# Patient Record
Sex: Male | Born: 1990
Health system: Southern US, Community
[De-identification: ages and names within clinical notes are randomized; demographics above are authoritative.]

## PROBLEM LIST (undated history)

## (undated) DIAGNOSIS — Z87442 Personal history of urinary calculi: Secondary | ICD-10-CM

## (undated) HISTORY — DX: Personal history of urinary calculi: Z87.442

---
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
## 2009-12-25 ENCOUNTER — Ambulatory Visit: Payer: Self-pay | Admitting: Family Medicine

## 2009-12-25 DIAGNOSIS — J309 Allergic rhinitis, unspecified: Secondary | ICD-10-CM | POA: Insufficient documentation

## 2009-12-25 DIAGNOSIS — IMO0002 Reserved for concepts with insufficient information to code with codable children: Secondary | ICD-10-CM | POA: Insufficient documentation

## 2009-12-25 DIAGNOSIS — S93409A Sprain of unspecified ligament of unspecified ankle, initial encounter: Secondary | ICD-10-CM | POA: Insufficient documentation

## 2009-12-27 ENCOUNTER — Ambulatory Visit: Payer: Self-pay | Admitting: Family Medicine

## 2009-12-27 LAB — CONVERTED CEMR LAB
Bilirubin Urine: NEGATIVE
Glucose, Urine, Semiquant: NEGATIVE
pH: 7

## 2009-12-29 LAB — CONVERTED CEMR LAB
BUN: 7 mg/dL (ref 6–23)
Bilirubin, Direct: 0 mg/dL (ref 0.0–0.3)
Calcium: 9.5 mg/dL (ref 8.4–10.5)
Chloride: 107 meq/L (ref 96–112)
Cholesterol: 171 mg/dL (ref 0–200)
Creatinine, Ser: 1 mg/dL (ref 0.4–1.5)
Eosinophils Absolute: 0.2 10*3/uL (ref 0.0–0.7)
Eosinophils Relative: 2.9 % (ref 0.0–5.0)
HDL: 33.2 mg/dL — ABNORMAL LOW (ref >39.00)
LDL Cholesterol: 102 mg/dL — ABNORMAL HIGH (ref 0–99)
MCHC: 34.6 g/dL (ref 30.0–36.0)
MCV: 86.9 fL (ref 78.0–100.0)
Monocytes Absolute: 0.7 10*3/uL (ref 0.1–1.0)
Neutrophils Relative %: 46.6 % (ref 43.0–77.0)
Platelets: 123 10*3/uL — ABNORMAL LOW (ref 150.0–400.0)
Total Bilirubin: 0.6 mg/dL (ref 0.3–1.2)
Triglycerides: 177 mg/dL — ABNORMAL HIGH (ref 0.0–149.0)
WBC: 7.1 10*3/uL (ref 4.5–10.5)

## 2010-11-15 NOTE — Assessment & Plan Note (Signed)
Summary: NEW PT EST (CPX REQ?) // RS   Vital Signs:  Patient profile:   20 year old male Height:      69 inches Weight:      222 pounds BMI:     32.90 Temp:     98.5 degrees F oral Pulse rate:   64 / minute Pulse rhythm:   regular BP sitting:   136 / 72  (left arm) Cuff size:   regular  Vitals Entered By: Raechel Ache, RN (December 25, 2009 1:22 PM) CC: New for CPX. C/o L ankle pain and knot under L knee.   History of Present Illness: 20 yr old male to establish with [place] and for a cpx. He feels good except for 2 areas of pain. Both of these pains started about the same time one month ago. He does not remember any particular trauma, but he does play a lot of basketball. He points to the medial left knee and the laeft lateral ankle as the trouble spots. They never swell. he used ice on them once but not since then. He takes nothing for them. He has stopped playing basketball for the past 2 weeks.   Preventive Screening-Counseling & Management  Alcohol-Tobacco     Smoking Status: never  Allergies (verified): No Known Drug Allergies  Past History:  Past Medical History: Allergic rhinitis  Past Surgical History: bilateral inguinal hernia  repairs  as an infant  Family History: Reviewed history and no changes required. Family History of Alcoholism/Addiction Family History of Arthritis Family History Diabetes 1st degree relative Family History Hypertension  Social History: Reviewed history and no changes required. Occupation:  works at [employer] Alcohol use-no Smoking Status:  never Occupation:  employed  Review of Systems  The patient denies anorexia, fever, weight loss, weight gain, vision loss, decreased hearing, hoarseness, chest pain, syncope, dyspnea on exertion, peripheral edema, prolonged cough, headaches, hemoptysis, abdominal pain, melena, hematochezia, severe indigestion/heartburn, hematuria, incontinence, genital sores, muscle weakness,  suspicious skin lesions, transient blindness, difficulty walking, depression, unusual weight change, abnormal bleeding, enlarged lymph nodes, angioedema, breast masses, and testicular masses.    Physical Exam  General:  overweight-appearing.  Walks easily Head:  Normocephalic and atraumatic without obvious abnormalities. No apparent alopecia or balding. Eyes:  No corneal or conjunctival inflammation noted. EOMI. Perrla. Funduscopic exam benign, without hemorrhages, exudates or papilledema. Vision grossly normal. Ears:  External ear exam shows no significant lesions or deformities.  Otoscopic examination reveals clear canals, tympanic membranes are intact bilaterally without bulging, retraction, inflammation or discharge. Hearing is grossly normal bilaterally. Nose:  External nasal examination shows no deformity or inflammation. Nasal mucosa are pink and moist without lesions or exudates. Mouth:  Oral mucosa and oropharynx without lesions or exudates.  Teeth in good repair. Neck:  No deformities, masses, or tenderness noted. Chest Wall:  No deformities, masses, tenderness or gynecomastia noted. Lungs:  Normal respiratory effort, chest expands symmetrically. Lungs are clear to auscultation, no crackles or wheezes. Heart:  Normal rate and regular rhythm. S1 and S2 normal without gallop, murmur, click, rub or other extra sounds. Abdomen:  Bowel sounds positive,abdomen soft and non-tender without masses, organomegaly or hernias noted. Genitalia:  Testes bilaterally descended without nodularity, tenderness or masses. No scrotal masses or lesions. No penis lesions or urethral discharge. Msk:  No deformity or scoliosis noted of thoracic or lumbar spine.   Pulses:  R and L carotid,radial,femoral,dorsalis pedis and posterior tibial pulses are full and equal bilaterally  Extremities:  No clubbing, cyanosis, edema, or deformity noted with normal full range of motion of all joints.  The left knee has no swelling  or crepitus. Full ROM. he is tender along the medial side of the knee along the MCL. Anterior drawer and McMurrays are negative. The left lateral foot is slightly tender proximal and anterior to the lateral malleolus. Full ROM, no swelling. Neurologic:  No cranial nerve deficits noted. Station and gait are normal. Plantar reflexes are down-going bilaterally. DTRs are symmetrical throughout. Sensory, motor and coordinative functions appear intact. Skin:  Intact without suspicious lesions or rashes Cervical Nodes:  No lymphadenopathy noted Axillary Nodes:  No palpable lymphadenopathy Inguinal Nodes:  No significant adenopathy Psych:  Cognition and judgment appear intact. Alert and cooperative with normal attention span and concentration. No apparent delusions, illusions, hallucinations   Impression & Recommendations:  Problem # 1:  HEALTH MAINTENANCE EXAM (ICD-V70.0)  Problem # 2:  ANKLE SPRAIN (ICD-845.00)  Problem # 3:  KNEE SPRAIN (ICD-844.9)  Complete Medication List: 1)  Allegra 180 Mg Tabs (Fexofenadine hcl) .Marland Kitchen.. 1 once daily as needed  Patient Instructions: 1)  He seems to have an MCL sprain of the left knee and a high left ankle sprain, both probably from basketball. Rest, no basketball for another month. Use Aleeve as needed and ice packs. Wear Neoprene support sleeves. set up fasting labs

## 2012-01-17 ENCOUNTER — Ambulatory Visit: Payer: Self-pay

## 2012-01-17 ENCOUNTER — Other Ambulatory Visit: Payer: Self-pay | Admitting: Occupational Medicine

## 2012-01-17 DIAGNOSIS — R7611 Nonspecific reaction to tuberculin skin test without active tuberculosis: Secondary | ICD-10-CM

## 2013-06-24 ENCOUNTER — Encounter: Payer: Self-pay | Admitting: Family

## 2013-06-24 ENCOUNTER — Ambulatory Visit (INDEPENDENT_AMBULATORY_CARE_PROVIDER_SITE_OTHER): Payer: BC Managed Care – PPO | Admitting: Family

## 2013-06-24 ENCOUNTER — Ambulatory Visit (INDEPENDENT_AMBULATORY_CARE_PROVIDER_SITE_OTHER)
Admission: RE | Admit: 2013-06-24 | Discharge: 2013-06-24 | Disposition: A | Discharge status: Home / Self Care | Payer: BC Managed Care – PPO | Source: Ambulatory Visit | Attending: Family | Admitting: Family

## 2013-06-24 ENCOUNTER — Telehealth: Payer: Self-pay | Admitting: Family Medicine

## 2013-06-24 VITALS — BP 120/80 | HR 82 | Ht 69.0 in | Wt 222.0 lb

## 2013-06-24 DIAGNOSIS — R112 Nausea with vomiting, unspecified: Secondary | ICD-10-CM

## 2013-06-24 DIAGNOSIS — R109 Unspecified abdominal pain: Secondary | ICD-10-CM

## 2013-06-24 LAB — CBC WITH DIFFERENTIAL/PLATELET
Eosinophils Relative: 1.9 % (ref 0.0–5.0)
HCT: 46.3 % (ref 39.0–52.0)
Hemoglobin: 15.9 g/dL (ref 13.0–17.0)
Lymphs Abs: 3.7 10*3/uL (ref 0.7–4.0)
Monocytes Relative: 10 % (ref 3.0–12.0)
Neutro Abs: 6.1 10*3/uL (ref 1.4–7.7)
Platelets: 156 10*3/uL (ref 150.0–400.0)
WBC: 11.2 10*3/uL — ABNORMAL HIGH (ref 4.5–10.5)

## 2013-06-24 LAB — BASIC METABOLIC PANEL
CO2: 29 mEq/L (ref 19–32)
Calcium: 9.5 mg/dL (ref 8.4–10.5)
Glucose, Bld: 74 mg/dL (ref 70–99)
Sodium: 138 mEq/L (ref 135–145)

## 2013-06-24 NOTE — Telephone Encounter (Signed)
Pt not seen in over 3 years. Will you accept back as a new pt?

## 2013-06-24 NOTE — Patient Instructions (Signed)
Lactose Intolerance, Adult  Lactose intolerance is when the body is not able to digest lactose, a sugar found in milk and milk products. Lactose intolerance is caused by your body not producing enough of the enzyme lactase. When there is not enough lactase to digest the amount of lactose consumed, discomfort may be felt. Lactose intolerance is not a milk allergy.  For most people, lactase deficiency is a condition that develops naturally over time. After about the age of 2, the body begins to produce less lactase. But many people may not experience symptoms until they are much older.  CAUSES  Things that can cause you to be lactose intolerant include:  · Aging.  · Being born without the ability to make lactase.  · Certain digestive diseases.  · Injuries to the small intestine.  SYMPTOMS   · Feeling sick to your stomach (nauseous).  · Diarrhea.  · Cramps.  · Bloating.  · Gas.  Symptoms usually show up a half hour or 2 hours after eating or drinking products containing lactose.  TREATMENT   No treatment can improve the body's ability to produce lactase. However, symptoms can be controlled through diet. A medicine may be given to you to take when you consume lactose-containing foods or drinks. The medicine contains the lactase enzyme, which help the body digest lactose better.  HOME CARE INSTRUCTIONS  · Eat or drink dairy products as told by your caregiver or dietician.  · Take all medicine as directed by your caregiver.  · Find lactose-free or lactose-reduced products at your local grocery store.  · Talk to your caregiver or dietician to decide if you need any dietary supplements.  The following is the amount of calcium needed from the diet:  · 19 to 50 years: 1000 mg  · Over 50 years: 1200 mg  Calcium and Lactose in Common Foods  Non-Dairy Products / Calcium Content (mg)  · Calcium-fortified orange juice, 1 cup / 308 to 344 mg  · Sardines, with edible bones, 3 oz / 270 mg  · Salmon, canned, with edible bones, 3 oz /  205 mg  · Soymilk, fortified, 1 cup / 200 mg  · Broccoli (raw), 1 cup / 90 mg  · Orange, 1 medium / 50 mg  · Pinto beans, ½ cup / 40 mg  · Tuna, canned, 3 oz / 10 mg  · Lettuce greens, ½ cup / 10 mg  Dairy Products / Calcium Content (mg) / Lactose Content (g)  · Yogurt, plain, low-fat, 1 cup / 415 mg / 5 g  · Milk, reduced fat, 1 cup / 295 mg / 11 g  · Swiss cheese, 1 oz / 270 mg / 1 g  · Ice cream, ½ cup / 85 mg / 6 g  · Cottage cheese, ½ cup / 75 mg / 2 to 3 g  SEEK MEDICAL CARE IF:  You have no relief from your symptoms.  Document Released: 09/30/2005 Document Revised: 12/23/2011 Document Reviewed: 12/28/2010  Exit[employer]® Patient Information ©2014 [employer].

## 2013-06-24 NOTE — Telephone Encounter (Signed)
Yes I can see him again  

## 2013-06-25 ENCOUNTER — Encounter: Payer: Self-pay | Admitting: Family

## 2013-06-25 LAB — LACTOSE TOLERANCE TEST

## 2013-06-25 NOTE — Progress Notes (Signed)
  Subjective:    Patient ID: Anthony Gaines, male    DOB: 1991/02/24, 22 y.o.   MRN: [id number]  HPI 22 year old African American male, nonsmoker is in with complaints of right lower quadrant abdominal pain that occurred yesterday and has subsequently resolved. He describes the pain as sharp and constant. He had an episode similar to this about 2-3 weeks ago. He reports an episode approximately one month ago. Denies any nausea, vomiting, diarrhea or constipation. Has daily bowel movements. Patient feels like it may be related to the consumption of milk and cheese. No family history of any colon problems.   Review of Systems  Constitutional: Negative.   Respiratory: Negative.   Cardiovascular: Negative.   Gastrointestinal: Positive for abdominal pain. Negative for nausea, vomiting, diarrhea and constipation.  Genitourinary: Negative.   Musculoskeletal: Negative.   Skin: Negative.   Neurological: Negative.   Hematological: Negative.   Psychiatric/Behavioral: Negative.    History reviewed. No pertinent past medical history.  History   Social History  . Marital Status: Married    Spouse Name: N/A    Number of Children: N/A  . Years of Education: N/A   Occupational History  . Not on file.   Social History Main Topics  . Smoking status: Never Smoker   . Smokeless tobacco: Not on file  . Alcohol Use: Yes  . Drug Use: No  . Sexual Activity: Not on file   Other Topics Concern  . Not on file   Social History Narrative  . No narrative on file    History reviewed. No pertinent past surgical history.  No family history on file.  No Known Allergies  No current outpatient prescriptions on file prior to visit.   No current facility-administered medications on file prior to visit.    BP 120/80  Pulse 82  Ht 5\' 9"  (1.753 m)  Wt 222 lb (100.699 kg)  BMI 32.77 kg/m2chart    Objective:   Physical Exam  Constitutional: He is oriented to person, place, and time. He appears  well-developed.  Neck: Normal range of motion. Neck supple.  Cardiovascular: Normal rate, regular rhythm and normal heart sounds.   Pulmonary/Chest: Effort normal and breath sounds normal.  Abdominal: Soft. Bowel sounds are normal. He exhibits no distension. There is no tenderness. There is no rebound and no guarding.  Musculoskeletal: Normal range of motion.  Neurological: He is alert and oriented to person, place, and time.  Skin: Skin is warm.  Psychiatric: He has a normal mood and affect.          Assessment & Plan:  Assessment: 1. Irritable bowel syndrome 2. Probable lactose intolerance  Plan: Labs sent with the patient the results. KUB ordered. We'll discuss further treatment plan thereafter. Call the office if any questions or concerns. Lactose-free diet.

## 2013-10-14 DIAGNOSIS — Z87442 Personal history of urinary calculi: Secondary | ICD-10-CM

## 2013-10-14 HISTORY — DX: Personal history of urinary calculi: Z87.442

## 2014-02-07 ENCOUNTER — Encounter (HOSPITAL_COMMUNITY): Payer: Self-pay | Admitting: Emergency Medicine

## 2014-02-07 ENCOUNTER — Emergency Department (HOSPITAL_COMMUNITY): Payer: BC Managed Care – PPO

## 2014-02-07 ENCOUNTER — Emergency Department (HOSPITAL_COMMUNITY)
Admission: EM | Admit: 2014-02-07 | Discharge: 2014-02-08 | Disposition: A | Discharge status: Home / Self Care | Payer: BC Managed Care – PPO | Attending: Emergency Medicine | Admitting: Emergency Medicine

## 2014-02-07 DIAGNOSIS — N1330 Unspecified hydronephrosis: Secondary | ICD-10-CM

## 2014-02-07 DIAGNOSIS — R112 Nausea with vomiting, unspecified: Secondary | ICD-10-CM

## 2014-02-07 DIAGNOSIS — R1031 Right lower quadrant pain: Secondary | ICD-10-CM

## 2014-02-07 DIAGNOSIS — N201 Calculus of ureter: Secondary | ICD-10-CM

## 2014-02-07 LAB — CBC WITH DIFFERENTIAL/PLATELET
Basophils Absolute: 0 10*3/uL (ref 0.0–0.1)
Basophils Relative: 0 % (ref 0–1)
Eosinophils Absolute: 0 10*3/uL (ref 0.0–0.7)
Eosinophils Relative: 0 % (ref 0–5)
HCT: 42.8 % (ref 39.0–52.0)
Hemoglobin: 15.4 g/dL (ref 13.0–17.0)
Lymphocytes Relative: 9 % — ABNORMAL LOW (ref 12–46)
Lymphs Abs: 1.7 10*3/uL (ref 0.7–4.0)
MCH: 29.4 pg (ref 26.0–34.0)
MCHC: 36 g/dL (ref 30.0–36.0)
MCV: 81.7 fL (ref 78.0–100.0)
Monocytes Absolute: 1.2 10*3/uL — ABNORMAL HIGH (ref 0.1–1.0)
Monocytes Relative: 6 % (ref 3–12)
Neutro Abs: 16.4 10*3/uL — ABNORMAL HIGH (ref 1.7–7.7)
Neutrophils Relative %: 85 % — ABNORMAL HIGH (ref 43–77)
Platelets: 128 10*3/uL — ABNORMAL LOW (ref 150–400)
RBC: 5.24 MIL/uL (ref 4.22–5.81)
RDW: 13.2 % (ref 11.5–15.5)
WBC: 19.3 10*3/uL — ABNORMAL HIGH (ref 4.0–10.5)

## 2014-02-07 LAB — LIPASE, BLOOD: Lipase: 27 U/L (ref 11–59)

## 2014-02-07 LAB — COMPREHENSIVE METABOLIC PANEL
ALT: 55 U/L — ABNORMAL HIGH (ref 0–53)
AST: 30 U/L (ref 0–37)
Albumin: 4.2 g/dL (ref 3.5–5.2)
Alkaline Phosphatase: 68 U/L (ref 39–117)
BUN: 10 mg/dL (ref 6–23)
CO2: 23 mEq/L (ref 19–32)
Calcium: 9.5 mg/dL (ref 8.4–10.5)
Chloride: 100 mEq/L (ref 96–112)
Creatinine, Ser: 1.14 mg/dL (ref 0.50–1.35)
GFR calc Af Amer: >90 mL/min (ref >90)
GFR calc non Af Amer: 89 mL/min — ABNORMAL LOW (ref >90)
Glucose, Bld: 123 mg/dL — ABNORMAL HIGH (ref 70–99)
Potassium: 3.8 mEq/L (ref 3.7–5.3)
Sodium: 138 mEq/L (ref 137–147)
Total Bilirubin: 0.3 mg/dL (ref 0.3–1.2)
Total Protein: 8.1 g/dL (ref 6.0–8.3)

## 2014-02-07 MED ORDER — KETOROLAC TROMETHAMINE 30 MG/ML IJ SOLN
30.0000 mg | Freq: Once | INTRAMUSCULAR | Status: AC
  Administered 2014-02-07: 30 mg via INTRAVENOUS
  Filled 2014-02-07: qty 1

## 2014-02-07 MED ORDER — HYDROMORPHONE HCL PF 1 MG/ML IJ SOLN
1.0000 mg | Freq: Once | INTRAMUSCULAR | Status: AC
  Administered 2014-02-07: 1 mg via INTRAVENOUS
  Filled 2014-02-07: qty 1

## 2014-02-07 MED ORDER — ONDANSETRON 8 MG/NS 50 ML IVPB
8.0000 mg | Freq: Once | INTRAVENOUS | Status: AC
  Administered 2014-02-07: 8 mg via INTRAVENOUS
  Filled 2014-02-07: qty 8

## 2014-02-07 MED ORDER — IOHEXOL 300 MG/ML  SOLN
50.0000 mL | Freq: Once | INTRAMUSCULAR | Status: AC | PRN
  Administered 2014-02-07: 50 mL via ORAL

## 2014-02-07 MED ORDER — IOHEXOL 300 MG/ML  SOLN
100.0000 mL | Freq: Once | INTRAMUSCULAR | Status: AC | PRN
  Administered 2014-02-07: 100 mL via INTRAVENOUS

## 2014-02-07 MED ORDER — HYDROMORPHONE HCL PF 1 MG/ML IJ SOLN
0.5000 mg | Freq: Once | INTRAMUSCULAR | Status: AC
  Administered 2014-02-07: 0.5 mg via INTRAVENOUS
  Filled 2014-02-07: qty 1

## 2014-02-07 NOTE — ED Notes (Signed)
Patient c/o RLQ abd pain, reports a few intermittent episodes in the past. States today it increased around 1730.

## 2014-02-07 NOTE — ED Provider Notes (Signed)
CSN: [id number]     Arrival date & time 02/07/14  2031 History   First MD Initiated Contact with Patient 02/07/14 2046     Chief Complaint  Patient presents with  . Abdominal Pain    RLQ     (Consider location/radiation/quality/duration/timing/severity/associated sxs/prior Treatment) HPI Pt is a 22yo male c/o RLQ abdominal pain that started around 17:30 this afternoon. Reports several intermittent episodes of sharp, stabbing pain. Pain became more constant, 10/10 at this time.  Reports associated nausea and 1 episode of vomiting earlier today. Reports hx of similar pain a few months ago, was seen by urgent care who performed x-rays but nothing was found to be causing pt's symptoms.  Pt states pain eventually went away on its own.  Reports hx of hernia repair at 2mo old, no additional abdominal surgeries or problems since then. Pt states he does have some dysuria. Denies fevers. Denies trauma to area. Has not taken anything at home for pain PTA.  History reviewed. No pertinent past medical history. History reviewed. No pertinent past surgical history. No family history on file. History  Substance Use Topics  . Smoking status: Never Smoker   . Smokeless tobacco: Not on file  . Alcohol Use: Yes     Comment: socially    Review of Systems  Constitutional: Negative for fever and chills.  Respiratory: Negative for shortness of breath.   Cardiovascular: Negative for chest pain.  Gastrointestinal: Positive for nausea, vomiting ( x1) and abdominal pain. Negative for diarrhea and constipation.  Genitourinary: Positive for dysuria. Negative for frequency, hematuria, flank pain, decreased urine volume, discharge, penile swelling, scrotal swelling, penile pain and testicular pain.  All other systems reviewed and are negative.     Allergies  Review of patient's allergies indicates no known allergies.  Home Medications   Prior to Admission medications   Not on File   BP 122/67  Pulse 66   Temp(Src) 97.9 F (36.6 C) (Oral)  Resp 16  Ht 5\' 10"  (1.778 m)  Wt 225 lb (102.059 kg)  BMI 32.28 kg/m2  SpO2 95% Physical Exam  Nursing note and vitals reviewed. Constitutional: He appears well-developed and well-nourished.  Pt lying on left side in exam bed, holding emesis bag, appears uncomfortable.  HENT:  Head: Normocephalic and atraumatic.  Eyes: Conjunctivae are normal. No scleral icterus.  Neck: Normal range of motion.  Cardiovascular: Normal rate, regular rhythm and normal heart sounds.   Pulmonary/Chest: Effort normal and breath sounds normal. No respiratory distress. He has no wheezes. He has no rales. He exhibits no tenderness.  Abdominal: Soft. Bowel sounds are normal. He exhibits no distension and no mass. There is tenderness. There is guarding. There is no rebound.  Soft, diffuse tenderness with guarding, pain worse in RLQ and suprapubic region.  Musculoskeletal: Normal range of motion.  Neurological: He is alert.  Skin: Skin is warm and dry.    ED Course  Procedures (including critical care time) Labs Review Labs Reviewed  CBC WITH DIFFERENTIAL - Abnormal; Notable for the following:    WBC 19.3 (*)    Platelets 128 (*)    Neutrophils Relative % 85 (*)    Neutro Abs 16.4 (*)    Lymphocytes Relative 9 (*)    Monocytes Absolute 1.2 (*)    All other components within normal limits  COMPREHENSIVE METABOLIC PANEL - Abnormal; Notable for the following:    Glucose, Bld 123 (*)    ALT 55 (*)    GFR calc non  Af Amer 89 (*)    All other components within normal limits  URINALYSIS, ROUTINE W REFLEX MICROSCOPIC - Abnormal; Notable for the following:    Hgb urine dipstick SMALL (*)    All other components within normal limits  LIPASE, BLOOD  URINE MICROSCOPIC-ADD ON    Imaging Review Ct Abdomen Pelvis W Contrast  02/08/2014   CLINICAL DATA:  Right lower quadrant abdominal pain.  EXAM: CT ABDOMEN AND PELVIS WITH CONTRAST  TECHNIQUE: Multidetector CT imaging of  the abdomen and pelvis was performed using the standard protocol following bolus administration of intravenous contrast.  CONTRAST:  50mL OMNIPAQUE IOHEXOL 300 MG/ML SOLN, 100mL OMNIPAQUE IOHEXOL 300 MG/ML SOLN  COMPARISON:  Abdominal radiograph from 06/24/2013  FINDINGS: The visualized lung bases are clear.  The liver and spleen are unremarkable in appearance. The gallbladder is within normal limits. The pancreas and adrenal glands are unremarkable.  There is moderate to severe right-sided hydronephrosis, with diffuse prominence of the right ureter along its entire course. An obstructing 8 x 6 mm stone is noted in the distal right ureter, approximately 1 cm proximal to the right vesicoureteral junction. Mild right-sided perinephric stranding is seen.  A nonobstructing 3 mm stone is noted at the interpole region of the right kidney. Evaluation for left-sided renal stones is limited due to trace contrast within the left renal calyces.  No free fluid is identified. The small bowel is unremarkable in appearance. The stomach is within normal limits. No acute vascular abnormalities are seen.  The appendix is normal in caliber, without evidence for appendicitis. The colon is unremarkable in appearance.  The bladder is decompressed and not well assessed. The prostate remains normal in size. No inguinal lymphadenopathy is seen.  No acute osseous abnormalities are identified.  IMPRESSION: 1. Moderate to severe right-sided hydronephrosis, with diffuse prominence of the right ureter. Obstructing 8 x 6 mm stone noted in the distal right ureter, 1 cm proximal to the right vesicoureteral junction. 2. Nonobstructing 3 mm stone noted at the interpole region of the right kidney.   Electronically Signed   By: Roanna RaiderJeffery  Chang M.D.   On: 02/08/2014 00:17     EKG Interpretation None      MDM   Final diagnoses:  Right ureteral stone  Hydronephrosis of right kidney  RLQ abdominal pain  Nausea & vomiting    Pt presenting to  ED with c/o RLQ pain, intermittent today, becoming worse and more constant since onset around 17:30 this evening.  Vitals: afebrile. Pt severely uncomfortable upon arrival to ED,  1mg  IV dilaudid given to pt which helped with pt's pain immediately.  zofran also given.  Concern for appendicitis, vs renal stone, vs gastritis.  Labs: CBC, CMP, Lipase, UA as well as CT abdomen/pelvis ordered.   CBC significant for elevated WBC-19.3, otherwise unremarkable. UA-no evidence of UTI. CT abd/pelvis: moderate to severe right-sided hydronephrosis, with diffuse prominence of right ureter. Obstructing 8x766mm stone noted in distal right ureter.  Consulted [facility], pt may call office in the morning to f/u for further treatment as pt is stable at this time. Pain and nausea are well managed with medications, pt is afebrile, and no evidence of urinary infection.  Discussed plan with pt who agrees.  Will discharge home with percocet and phenergan. Return precautions provided. Pt verbalized understanding and agreement with tx plan.  Discussed pt with Dr. Elesa MassedWard during pt's ED stay, agrees with tx and plan.      Junius Finnerrin O'Malley, PA-C 02/08/14 0111

## 2014-02-07 NOTE — ED Notes (Signed)
Pt states he is unable to urinate at this time.

## 2014-02-08 ENCOUNTER — Ambulatory Visit (HOSPITAL_COMMUNITY): Payer: BC Managed Care – PPO | Admitting: Anesthesiology

## 2014-02-08 ENCOUNTER — Ambulatory Visit (HOSPITAL_COMMUNITY)
Admission: RE | Admit: 2014-02-08 | Discharge: 2014-02-08 | Disposition: A | Discharge status: Home / Self Care | Payer: BC Managed Care – PPO | Source: Ambulatory Visit | Attending: Urology | Admitting: Urology

## 2014-02-08 ENCOUNTER — Encounter (HOSPITAL_COMMUNITY)
Admission: RE | Disposition: A | Discharge status: Home / Self Care | Payer: Self-pay | Source: Ambulatory Visit | Attending: Urology

## 2014-02-08 ENCOUNTER — Encounter (HOSPITAL_COMMUNITY): Payer: Self-pay | Admitting: *Deleted

## 2014-02-08 ENCOUNTER — Other Ambulatory Visit: Payer: Self-pay | Admitting: Urology

## 2014-02-08 ENCOUNTER — Encounter (HOSPITAL_COMMUNITY): Payer: BC Managed Care – PPO | Admitting: Anesthesiology

## 2014-02-08 DIAGNOSIS — N200 Calculus of kidney: Secondary | ICD-10-CM

## 2014-02-08 DIAGNOSIS — N201 Calculus of ureter: Secondary | ICD-10-CM | POA: Insufficient documentation

## 2014-02-08 DIAGNOSIS — N1330 Unspecified hydronephrosis: Secondary | ICD-10-CM | POA: Insufficient documentation

## 2014-02-08 HISTORY — PX: CYSTOSCOPY WITH URETEROSCOPY AND STENT PLACEMENT: SHX6377

## 2014-02-08 LAB — URINALYSIS, ROUTINE W REFLEX MICROSCOPIC
Bilirubin Urine: NEGATIVE
Glucose, UA: NEGATIVE mg/dL
Ketones, ur: NEGATIVE mg/dL
Leukocytes, UA: NEGATIVE
Nitrite: NEGATIVE
Protein, ur: NEGATIVE mg/dL
Specific Gravity, Urine: 1.023 (ref 1.005–1.030)
Urobilinogen, UA: 1 mg/dL (ref 0.0–1.0)
pH: 7 (ref 5.0–8.0)

## 2014-02-08 LAB — URINE MICROSCOPIC-ADD ON

## 2014-02-08 SURGERY — CYSTOURETEROSCOPY, WITH STENT INSERTION
Anesthesia: General | Site: Pelvis | Laterality: Right

## 2014-02-08 MED ORDER — LIDOCAINE HCL 2 % EX GEL
CUTANEOUS | Status: DC | PRN
  Administered 2014-02-08: 1 via URETHRAL

## 2014-02-08 MED ORDER — PROPOFOL 10 MG/ML IV BOLUS
INTRAVENOUS | Status: AC
Start: 2014-02-08 — End: 2014-02-08
  Filled 2014-02-08: qty 20

## 2014-02-08 MED ORDER — HYDROMORPHONE HCL PF 1 MG/ML IJ SOLN
0.5000 mg | Freq: Once | INTRAMUSCULAR | Status: AC
  Administered 2014-02-08: 0.5 mg via INTRAVENOUS
  Filled 2014-02-08: qty 1

## 2014-02-08 MED ORDER — CIPROFLOXACIN IN D5W 400 MG/200ML IV SOLN
400.0000 mg | INTRAVENOUS | Status: AC
  Administered 2014-02-08: 400 mg via INTRAVENOUS

## 2014-02-08 MED ORDER — DEXAMETHASONE SODIUM PHOSPHATE 10 MG/ML IJ SOLN
INTRAMUSCULAR | Status: DC | PRN
  Administered 2014-02-08: 10 mg via INTRAVENOUS

## 2014-02-08 MED ORDER — FENTANYL CITRATE 0.05 MG/ML IJ SOLN: 25.0000 ug | INTRAMUSCULAR | Status: DC | PRN

## 2014-02-08 MED ORDER — CIPROFLOXACIN IN D5W 400 MG/200ML IV SOLN
INTRAVENOUS | Status: AC
  Filled 2014-02-08: qty 200

## 2014-02-08 MED ORDER — FENTANYL CITRATE 0.05 MG/ML IJ SOLN
INTRAMUSCULAR | Status: DC | PRN
  Administered 2014-02-08 (×2): 50 ug via INTRAVENOUS

## 2014-02-08 MED ORDER — KETOROLAC TROMETHAMINE 30 MG/ML IJ SOLN: 15.0000 mg | Freq: Once | INTRAMUSCULAR | Status: DC | PRN

## 2014-02-08 MED ORDER — PROPOFOL 10 MG/ML IV BOLUS
INTRAVENOUS | Status: AC
  Filled 2014-02-08: qty 20

## 2014-02-08 MED ORDER — 0.9 % SODIUM CHLORIDE (POUR BTL) OPTIME
TOPICAL | Status: DC | PRN
  Administered 2014-02-08: 1000 mL

## 2014-02-08 MED ORDER — BELLADONNA ALKALOIDS-OPIUM 16.2-60 MG RE SUPP
RECTAL | Status: DC | PRN
  Administered 2014-02-08: 1 via RECTAL

## 2014-02-08 MED ORDER — TAMSULOSIN HCL 0.4 MG PO CAPS: 0.4000 mg | ORAL_CAPSULE | Freq: Every day | ORAL | Status: DC

## 2014-02-08 MED ORDER — PROMETHAZINE HCL 25 MG PO TABS: 25.0000 mg | ORAL_TABLET | Freq: Four times a day (QID) | ORAL | Status: DC | PRN

## 2014-02-08 MED ORDER — PROMETHAZINE HCL 25 MG/ML IJ SOLN: 6.2500 mg | INTRAMUSCULAR | Status: DC | PRN

## 2014-02-08 MED ORDER — ONDANSETRON HCL 4 MG/2ML IJ SOLN
INTRAMUSCULAR | Status: AC
  Filled 2014-02-08: qty 2

## 2014-02-08 MED ORDER — FENTANYL CITRATE 0.05 MG/ML IJ SOLN
INTRAMUSCULAR | Status: AC
  Filled 2014-02-08: qty 2

## 2014-02-08 MED ORDER — SODIUM CHLORIDE 0.9 % IR SOLN
Status: DC | PRN
  Administered 2014-02-08: 3000 mL

## 2014-02-08 MED ORDER — LIDOCAINE HCL (CARDIAC) 20 MG/ML IV SOLN
INTRAVENOUS | Status: AC
  Filled 2014-02-08: qty 5

## 2014-02-08 MED ORDER — LIDOCAINE HCL 2 % EX GEL
CUTANEOUS | Status: AC
  Filled 2014-02-08: qty 10

## 2014-02-08 MED ORDER — LIDOCAINE HCL (CARDIAC) 20 MG/ML IV SOLN
INTRAVENOUS | Status: DC | PRN
  Administered 2014-02-08: 20 mg via INTRAVENOUS

## 2014-02-08 MED ORDER — ONDANSETRON HCL 4 MG/2ML IJ SOLN
INTRAMUSCULAR | Status: DC | PRN
  Administered 2014-02-08: 4 mg via INTRAVENOUS

## 2014-02-08 MED ORDER — PHENAZOPYRIDINE HCL 200 MG PO TABS: 200.0000 mg | ORAL_TABLET | Freq: Three times a day (TID) | ORAL | Status: DC | PRN

## 2014-02-08 MED ORDER — LACTATED RINGERS IV SOLN
INTRAVENOUS | Status: DC
  Administered 2014-02-08: 19:00:00 via INTRAVENOUS
  Administered 2014-02-08: 1000 mL via INTRAVENOUS

## 2014-02-08 MED ORDER — MIDAZOLAM HCL 2 MG/2ML IJ SOLN
INTRAMUSCULAR | Status: AC
  Filled 2014-02-08: qty 2

## 2014-02-08 MED ORDER — MIDAZOLAM HCL 5 MG/5ML IJ SOLN
INTRAMUSCULAR | Status: DC | PRN
  Administered 2014-02-08: 2 mg via INTRAVENOUS

## 2014-02-08 MED ORDER — PROPOFOL 10 MG/ML IV BOLUS
INTRAVENOUS | Status: DC | PRN
Start: 2014-02-08 — End: 2014-02-08
  Administered 2014-02-08: 200 mg via INTRAVENOUS

## 2014-02-08 MED ORDER — OXYCODONE-ACETAMINOPHEN 5-325 MG PO TABS: 1.0000 | ORAL_TABLET | ORAL | Status: DC | PRN

## 2014-02-08 MED ORDER — BELLADONNA ALKALOIDS-OPIUM 16.2-60 MG RE SUPP
RECTAL | Status: AC
  Filled 2014-02-08: qty 1

## 2014-02-08 MED ORDER — IOHEXOL 300 MG/ML  SOLN
INTRAMUSCULAR | Status: DC | PRN
  Administered 2014-02-08: 22 mL

## 2014-02-08 MED ORDER — DEXAMETHASONE SODIUM PHOSPHATE 10 MG/ML IJ SOLN
INTRAMUSCULAR | Status: AC
  Filled 2014-02-08: qty 1

## 2014-02-08 SURGICAL SUPPLY — 22 items
APL SKNCLS STERI-STRIP NONHPOA (GAUZE/BANDAGES/DRESSINGS) ×1
BAG URO CATCHER STRL LF (DRAPE) ×2 IMPLANT
BASKET LASER NITINOL 1.9FR (BASKET) IMPLANT
BASKET ZERO TIP NITINOL 2.4FR (BASKET) IMPLANT
BENZOIN TINCTURE PRP APPL 2/3 (GAUZE/BANDAGES/DRESSINGS) ×2 IMPLANT
BSKT STON RTRVL ZERO TP 2.4FR (BASKET)
CATH CLEAR GEL 3F BACKSTOP (CATHETERS) ×2 IMPLANT
CATH URET 5FR 28IN OPEN ENDED (CATHETERS) ×2 IMPLANT
CLOTH BEACON ORANGE TIMEOUT ST (SAFETY) ×2 IMPLANT
DRAPE CAMERA CLOSED 9X96 (DRAPES) ×2 IMPLANT
DRSG TEGADERM 2-3/8X2-3/4 SM (GAUZE/BANDAGES/DRESSINGS) ×2 IMPLANT
EXTRACTOR STONE NITINOL NGAGE (UROLOGICAL SUPPLIES) ×2 IMPLANT
FIBER LASER FLEXIVA 365 (UROLOGICAL SUPPLIES) ×2 IMPLANT
GLOVE BIOGEL M STRL SZ7.5 (GLOVE) ×2 IMPLANT
GOWN STRL REUS W/TWL XL LVL3 (GOWN DISPOSABLE) ×2 IMPLANT
GUIDEWIRE ANG ZIPWIRE 038X150 (WIRE) IMPLANT
GUIDEWIRE STR DUAL SENSOR (WIRE) ×2 IMPLANT
PACK CYSTO (CUSTOM PROCEDURE TRAY) ×2 IMPLANT
STENT CONTOUR 6FRX26X.038 (STENTS) ×2 IMPLANT
SYRINGE 10CC LL (SYRINGE) ×2 IMPLANT
TUBE FEEDING 8FR 16IN STR KANG (MISCELLANEOUS) IMPLANT
TUBING CONNECTING 10 (TUBING) ×2 IMPLANT

## 2014-02-08 NOTE — Transfer of Care (Signed)
Immediate Anesthesia Transfer of Care Note  Patient: Anthony SequinWilliam Gaines  Procedure(s) Performed: Procedure(s) (LRB): CYSTOSCOPY WITH RIGHT RETROGRADE PYELOGRAM RIGHT  URETEROSCOPY WITH LASER LITHOTRIPSY  BASKET EXTRACTION AND RIGHT STENT PLACEMENT (Right)  Patient Location: PACU  Anesthesia Type: General  Level of Consciousness: sedated, patient cooperative and responds to stimulation  Airway & Oxygen Therapy: Patient Spontanous Breathing and Patient connected to face mask oxgen  Post-op Assessment: Report given to PACU RN and Post -op Vital signs reviewed and stable  Post vital signs: Reviewed and stable  Complications: No apparent anesthesia complications

## 2014-02-08 NOTE — Anesthesia Preprocedure Evaluation (Addendum)

## 2014-02-08 NOTE — Discharge Instructions (Signed)
DISCHARGE INSTRUCTIONS FOR KIDNEY STONES OR URETERAL STENT   MEDICATIONS:  1. DO NOT RESUME YOUR ASPIRIN, or any other medicines like ibuprofen, motrin, excedrin, advil, aleve, vitamin E, fish oil as these can all cause bleeding x 7 days.  2. Resume all your other meds from home - except do not take any other pain meds that you may have at home.  3. Take Tamsulosin to help with stent irritation. 5. Pyridium is to help with the burning/stinging when you urinate.   ACTIVITY:  1. No strenuous activity x 1week  2. No driving while on narcotic pain medications  3. Drink plenty of water  4. Continue to walk at home - you can still get blood clots when you are at home, so keep active, but don't over do it.  5. May return to work/school tomorrow or when you feel ready   BATHING:  1. You can shower and we recommend daily showers  2. You have a string coming from your urethra: The stent string is attached to your ureteral stent. Do not pull on this.   SIGNS/SYMPTOMS TO CALL:  Please call us if you have a fever greater than 101.5, uncontrolled nausea/vomiting, uncontrolled pain, dizziness, unable to urinate, bloody urine, chest pain, shortness of breath, leg swelling, leg pain, redness around wound, drainage from wound, or any other concerns or questions.   You can reach us at [phone].   FOLLOW-UP:  1. You have a string attached to your stent, and will removed in clinic at your follow-up appointment.

## 2014-02-08 NOTE — Anesthesia Postprocedure Evaluation (Signed)
  Anesthesia Post-op Note  Patient: Anthony Gaines  Procedure(s) Performed: Procedure(s) (LRB): CYSTOSCOPY WITH RIGHT RETROGRADE PYELOGRAM RIGHT  URETEROSCOPY WITH LASER LITHOTRIPSY  BASKET EXTRACTION AND RIGHT STENT PLACEMENT (Right)  Patient Location: PACU  Anesthesia Type: General  Level of Consciousness: awake and alert   Airway and Oxygen Therapy: Patient Spontanous Breathing  Post-op Pain: mild  Post-op Assessment: Post-op Vital signs reviewed, Patient's Cardiovascular Status Stable, Respiratory Function Stable, Patent Airway and No signs of Nausea or vomiting  Last Vitals:  Filed Vitals:   02/08/14 1800  BP: 121/61  Pulse: 89  Temp: 37.6 C  Resp: 20    Post-op Vital Signs: stable   Complications: No apparent anesthesia complications

## 2014-02-08 NOTE — ED Provider Notes (Signed)
Medical screening examination/treatment/procedure(s) were performed by non-physician practitioner and as supervising physician I was immediately available for consultation/collaboration.   EKG Interpretation None        Layla MawKristen N Drury Ardizzone, DO 02/08/14 0127

## 2014-02-08 NOTE — H&P (Signed)
Reason For Visit Right renal colic   History of Present Illness This is a 23 year old male who presented to the emergency department yesterday with acute onset right-sided flank pain. Workup revealed a 6 mm x 8 mm right obstructing distal ureteral stone, 1 cm proximal to the UVJ. The patient's urinalysis was remarkable for microscopic hematuria, no evidence of infection. His white blood cell count was elevated to 18,000. His renal function was normal. The patient had no fevers/chills. He was not having any voiding symptoms including dysuria or gross hematuria. The patient's pain was controlled with IV pain medication and he was discharged home and told to follow-up today for further management.  Currently the patient's pain is not well controlled.   Surgical History Problems  1. History of No Surgical Problems  Current Meds 1. OxyCODONE HCl TABS;  Therapy: (Recorded:28Apr2015) to Recorded  Allergies Medication  1. No Known Drug Allergies  Family History Problems  1. No pertinent family history : Mother  Social History Problems    Alcohol use (V49.89)   Never a smoker  Review of Systems Genitourinary, constitutional, skin, eye, otolaryngeal, hematologic/lymphatic, cardiovascular, pulmonary, endocrine, musculoskeletal, gastrointestinal, neurological and psychiatric system(s) were reviewed and pertinent findings if present are noted.  Genitourinary: hematuria.  Gastrointestinal: nausea, vomiting and diarrhea.  Musculoskeletal: back pain.    Vitals Vital Signs [Data Includes: Last 1 Day]  Recorded: 28Apr2015 10:50AM  Height: 5 ft 10 in Weight: 220 lb  BMI Calculated: 31.57 BSA Calculated: 2.17 Blood Pressure: 131 / 81 Temperature: 97.3 F Heart Rate: 82  Physical Exam Constitutional: Well nourished and well developed . No acute distress.  ENT:. The ears and nose are normal in appearance.  Neck: The appearance of the neck is normal and no neck mass is present.   Pulmonary: No respiratory distress, normal respiratory rhythm and effort and clear bilateral breath sounds.  Cardiovascular: Heart rate and rhythm are normal . The arterial pulses are normal. No peripheral edema.  Abdomen: The abdomen is soft and nontender. No masses are palpated. Tenderness in the RLQ is present. No CVA tenderness. No hernias are palpable. No hepatosplenomegaly noted.  Lymphatics: The femoral and inguinal nodes are not enlarged or tender.  Skin: Normal skin turgor, no visible rash and no visible skin lesions.  Neuro/Psych:. Mood and affect are appropriate.    Results/Data Urine [Data Includes: Last 1 Day]   28Apr2015  COLOR AMBER   APPEARANCE CLEAR   SPECIFIC GRAVITY >1.030   pH 5.5   GLUCOSE NEG mg/dL  BILIRUBIN SMALL   KETONE NEG mg/dL  BLOOD LARGE   PROTEIN 30 mg/dL  UROBILINOGEN 0.2 mg/dL  NITRITE NEG   LEUKOCYTE ESTERASE NEG   SQUAMOUS EPITHELIAL/HPF NONE SEEN   WBC NONE SEEN WBC/hpf  RBC 21-50 RBC/hpf  BACTERIA NONE SEEN   CRYSTALS Calcium Oxalate crystals noted   CASTS NONE SEEN    The following images/tracing/specimen were independently visualized: . CT scan abdomen/pelvis, stone protocol:  The patient has a 2 mm nonobstructing right renal stone. The patient has hydroureteronephrosis down to the distal ureter where there is an obstructing 6 x 8 mm right ureteral stone.    Assessment Assessed  1. Right ureteral calculus (592.1)  Right 6 x 8 mm distal ureteral stone with pain not well controlled.   Plan Health Maintenance  1. UA With REFLEX; [Do Not Release]; Status:Complete;   Done: 28Apr2015 10:16AM Right ureteral calculus  2. Follow-up Schedule Surgery Office  Follow-up  Status: Hold For - Appointment  Requested for: 28Apr2015  Discussion/Summary Plan is to take the patient to the operating room on urgent basis this afternoon. I have gone over the procedure with the patient detail including the risk and benefits. The patient understands  that he'll have a stent postoperatively. We'll most likely leave a string on the end of the stent and have the patient follow up next week for removal.

## 2014-02-08 NOTE — Op Note (Signed)
Preoperative diagnosis: right ureteral calculus/renal calculus  Postoperative diagnosis: right ureteral calculus/renal calculus  Procedure:  1. Cystoscopy 2. right ureteroscopy and stone removal 3. Ureteroscopic laser lithotripsy 4. right 72F x 26cm ureteral stent placement  5. right retrograde pyelography with interpretation  Surgeon: Crist FatBenjamin W. Pennye Beeghly, MD  Anesthesia: General  Complications: None  Intraoperative findings: right retrograde pyelography demonstrated a filling defect within the right ureter consistent with the patient's known calculus without other abnormalities.  EBL: Minimal  Specimens: 1. right ureteral calculus 2. Right renal calculus  Disposition of specimens: [facility] for stone analysis  Indication: Anthony Gaines is a 23 y.o.   patient with urolithiasis. After reviewing the management options for treatment, the patient elected to proceed with the above surgical procedure(s). We have discussed the potential benefits and risks of the procedure, side effects of the proposed treatment, the likelihood of the patient achieving the goals of the procedure, and any potential problems that might occur during the procedure or recuperation. Informed consent has been obtained.  Description of procedure:  The patient was taken to the operating room and general anesthesia was induced.  The patient was placed in the dorsal lithotomy position, prepped and draped in the usual sterile fashion, and preoperative antibiotics were administered. A preoperative time-out was performed.   Cystourethroscopy was performed.  The patient's urethra was examined and was normal with a nonobstructing prostate. The bladder was then systematically examined in its entirety. There was no evidence for any bladder tumors, stones, or other mucosal pathology.    Attention then turned to the right ureteral orifice and a ureteral catheter was used to intubate the ureteral orifice.   Omnipaque contrast was injected through the ureteral catheter and a retrograde pyelogram was performed with findings as dictated above.  A 0.38 sensor guidewire was then advanced up the right ureter into the renal pelvis under fluoroscopic guidance. The 6 Fr semirigid ureteroscope was then advanced into the ureter next to the guidewire and the calculus was identified.   I then inserted the backstop catheter and injected the gel several centimeters behind stone. The stone was then fragmented with the 365 micron holmium laser fiber on a setting of 0.6 and frequency of 6 Hz.   All stones were then removed from the ureter with an N-gage nitinol basket.  Reinspection of the ureter revealed no remaining visible stones or fragments. I then inserted a second wire into the renal pelvis and backed out the rigid ureteroscope. I then inserted the flexible ureteroscope over the wire and into the renal pelvis. I then removed the wire. I then performed pyeloscopy and was able to identify the renal calculi in the interpolar region and a narrow calyx. I then grabbed the stone with the N-gauge basket.   The wire was then backloaded through the cystoscope and a ureteral stent was advance over the wire using Seldinger technique.  The stent was positioned appropriately under fluoroscopic and cystoscopic guidance.  The wire was then removed with an adequate stent curl noted in the renal pelvis as well as in the bladder.  The bladder was then emptied and the procedure ended.  The patient appeared to tolerate the procedure well and without complications.  The patient was able to be awakened and transferred to the recovery unit in satisfactory condition.   Disposition: The tether of the stent was left on and secured to the ventral aspect of the patient's penis.  The patient will be scheduled for stent removal in 5-7 days  in our clinic.

## 2014-02-09 ENCOUNTER — Encounter (HOSPITAL_COMMUNITY): Payer: Self-pay | Admitting: Urology

## 2014-09-22 ENCOUNTER — Emergency Department (HOSPITAL_COMMUNITY)
Admission: EM | Admit: 2014-09-22 | Discharge: 2014-09-22 | Disposition: A | Discharge status: Home / Self Care | Payer: BC Managed Care – PPO | Attending: Emergency Medicine | Admitting: Emergency Medicine

## 2014-09-22 ENCOUNTER — Encounter (HOSPITAL_COMMUNITY): Payer: Self-pay | Admitting: Cardiology

## 2014-09-22 DIAGNOSIS — Y9389 Activity, other specified: Secondary | ICD-10-CM | POA: Insufficient documentation

## 2014-09-22 DIAGNOSIS — Y92410 Unspecified street and highway as the place of occurrence of the external cause: Secondary | ICD-10-CM | POA: Diagnosis not present

## 2014-09-22 DIAGNOSIS — S199XXA Unspecified injury of neck, initial encounter: Secondary | ICD-10-CM | POA: Diagnosis present

## 2014-09-22 DIAGNOSIS — Y998 Other external cause status: Secondary | ICD-10-CM | POA: Diagnosis not present

## 2014-09-22 DIAGNOSIS — S3992XA Unspecified injury of lower back, initial encounter: Secondary | ICD-10-CM | POA: Insufficient documentation

## 2014-09-22 MED ORDER — DIAZEPAM 5 MG PO TABS: 5.0000 mg | ORAL_TABLET | Freq: Two times a day (BID) | ORAL | Status: DC

## 2014-09-22 MED ORDER — IBUPROFEN 600 MG PO TABS: 600.0000 mg | ORAL_TABLET | Freq: Four times a day (QID) | ORAL | Status: DC | PRN

## 2014-09-22 NOTE — Discharge Instructions (Signed)

## 2014-09-22 NOTE — ED Notes (Signed)
Declined W/C at D/C and was escorted to lobby by RN. 
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
## 2015-10-10 IMAGING — CT CT ABD-PELV W/ CM
1 of 2 series · 15 of 32 positions shown, 19 images · IV contrast (omnipaque)
Comparison: Abdominal radiograph from 06/24/2013

CLINICAL DATA: Right lower quadrant abdominal pain.

EXAM:
CT ABDOMEN AND PELVIS WITH CONTRAST
TECHNIQUE: Multidetector CT imaging of the abdomen and pelvis was performed
using the standard protocol following bolus administration of
intravenous contrast.
CONTRAST:  50mL OMNIPAQUE IOHEXOL 300 MG/ML SOLN, 100mL OMNIPAQUE
IOHEXOL 300 MG/ML SOLN

[Series 2: abd/pel with · axial · 0.76mm/px · z∈[-511,-71]mm · 15 of 96 slices shown, 19 images]
[im 4/96  soft-tissue]
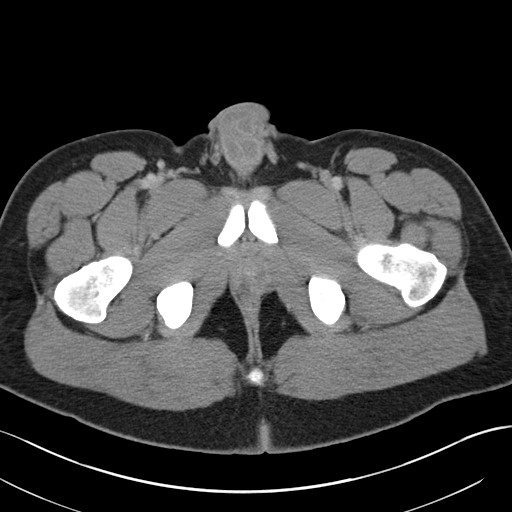
[im 4/96  bone]
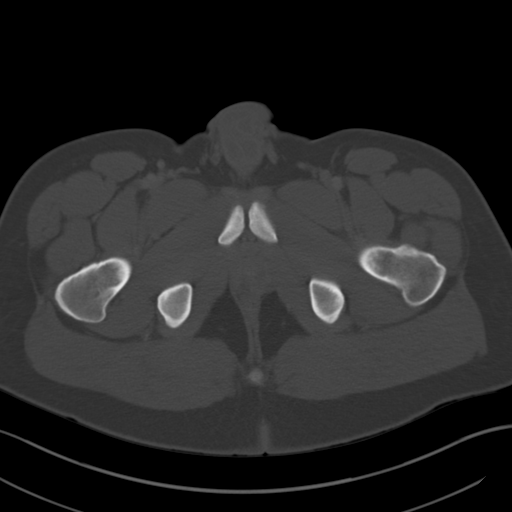
[im 12/96  soft-tissue]
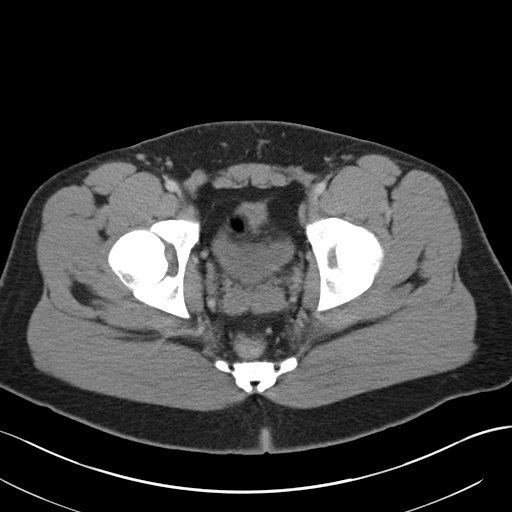
[im 20/96  soft-tissue]
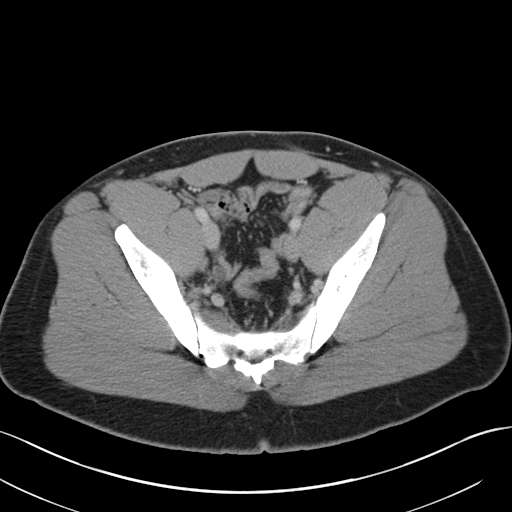
[im 28/96  soft-tissue]
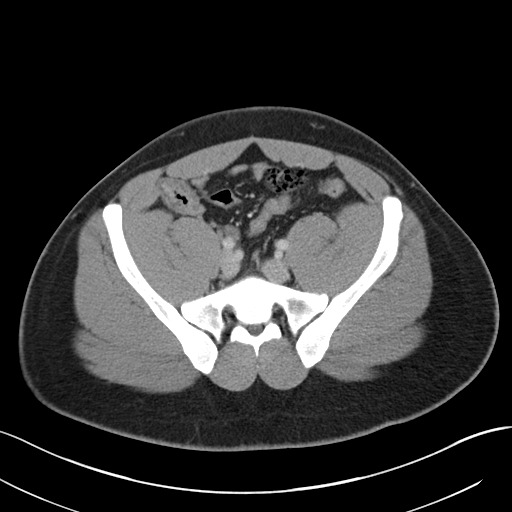
[im 32/96  soft-tissue]
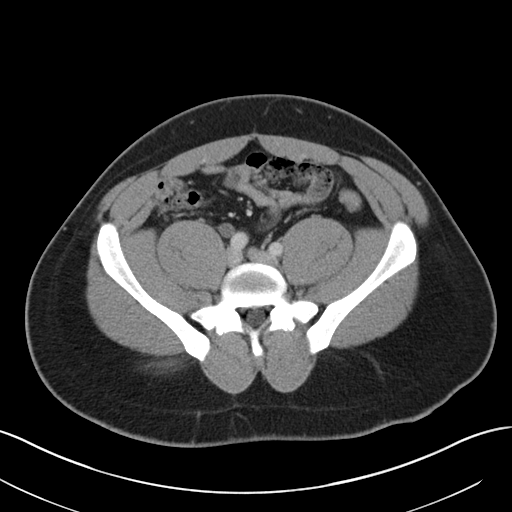
[im 40/96  soft-tissue]
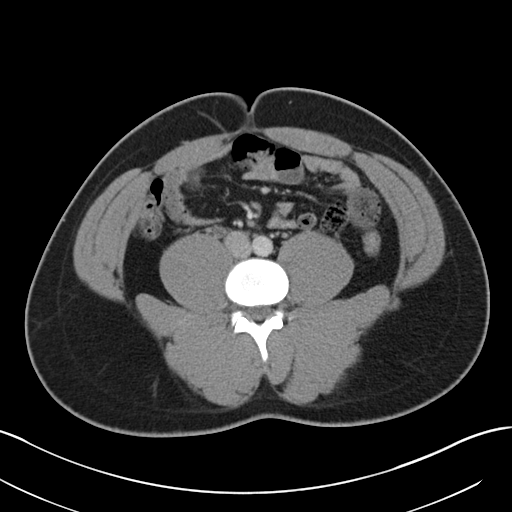
[im 48/96  soft-tissue]
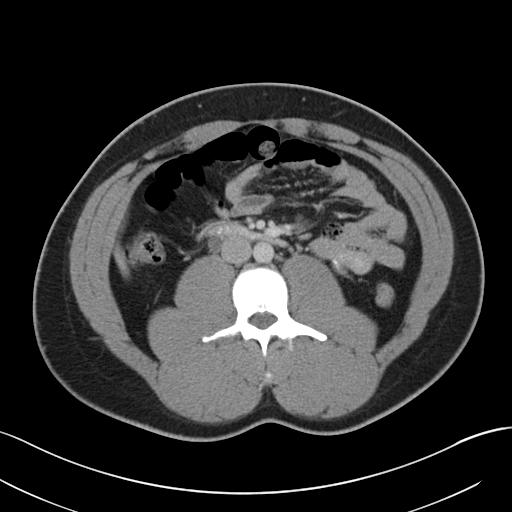
[im 56/96  soft-tissue]
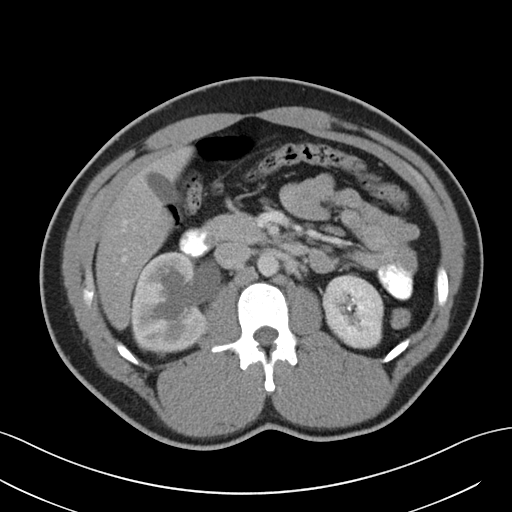
[im 64/96  soft-tissue]
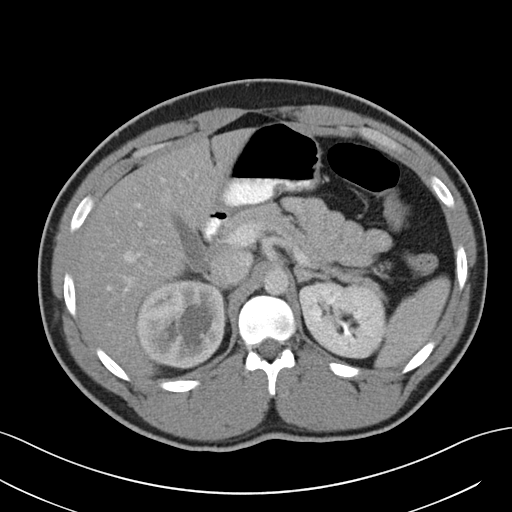
[im 64/96  bone]
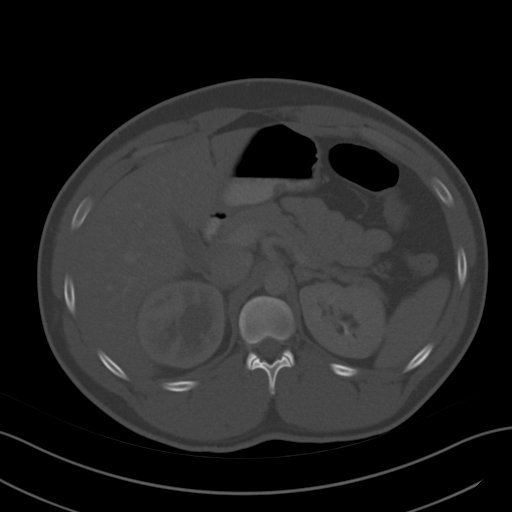
[im 68/96  soft-tissue]
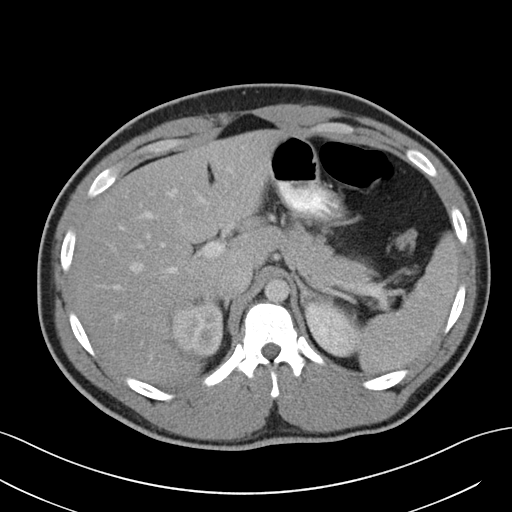
[im 76/96  soft-tissue]
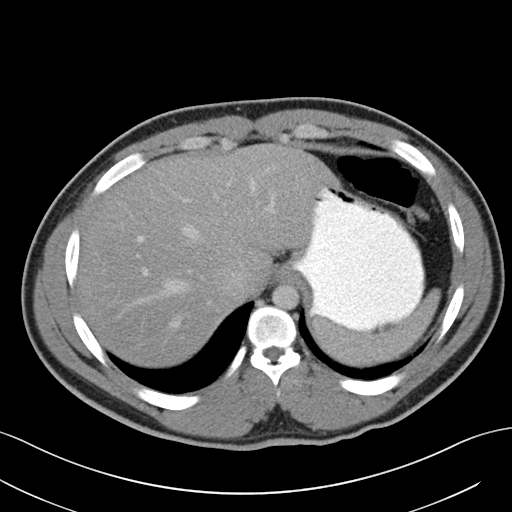
[im 80/96  lung]
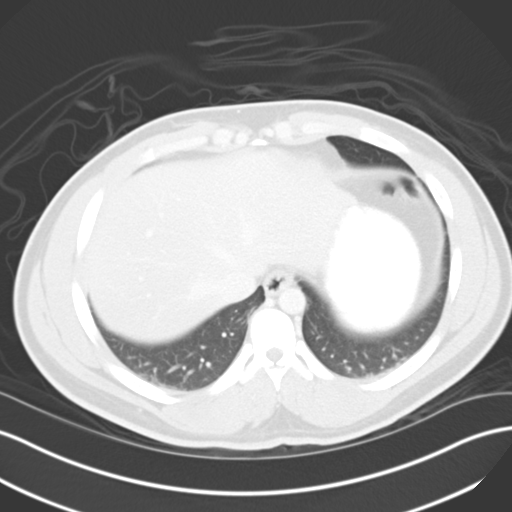
[im 84/96  soft-tissue]
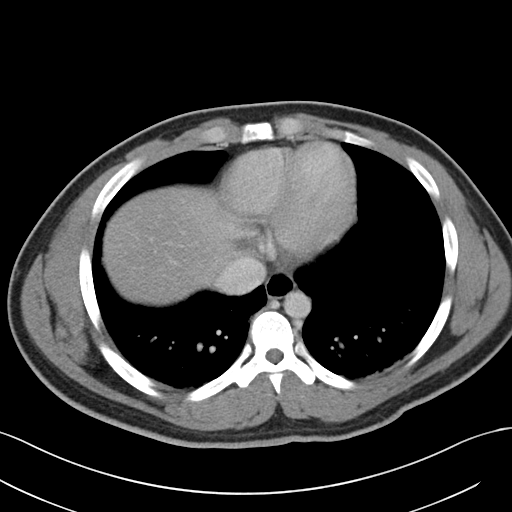
[im 84/96  lung]
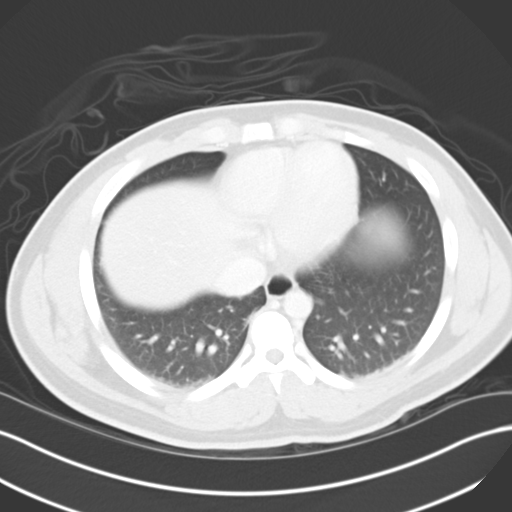
[im 88/96  lung]
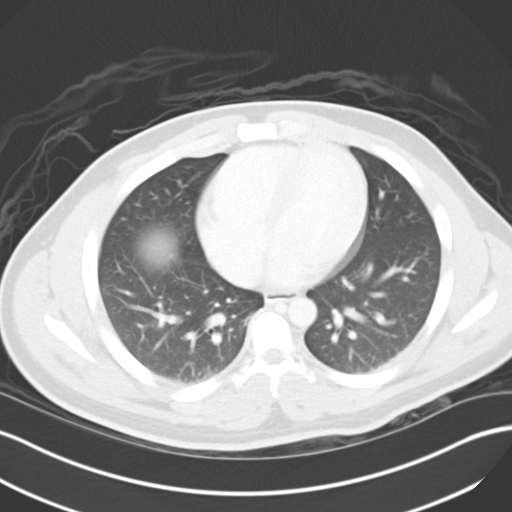
[im 92/96  soft-tissue]
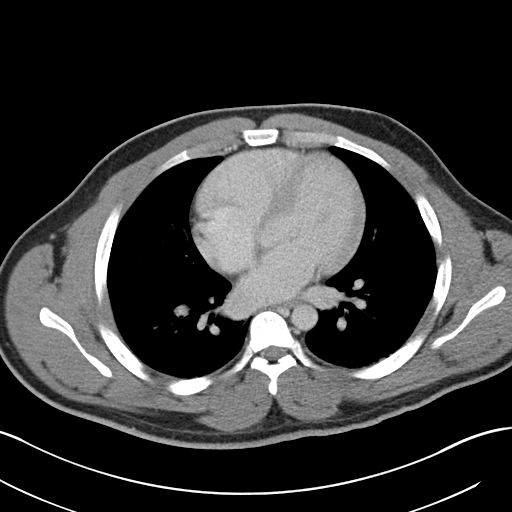
[im 92/96  lung]
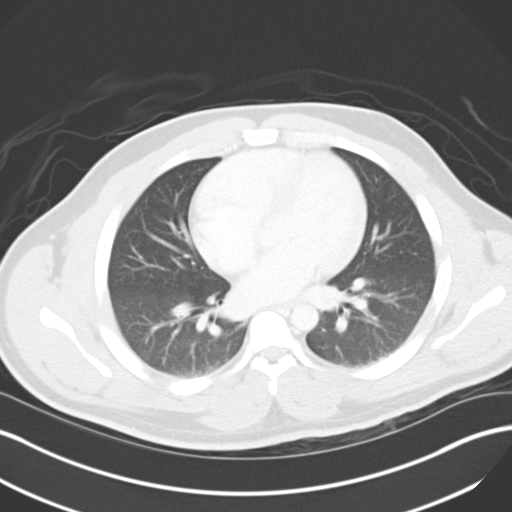

[15 of 32 positions shown; findings below may reference images not displayed]

FINDINGS: The visualized lung bases are clear.

The liver and spleen are unremarkable in appearance. The gallbladder
is within normal limits. The pancreas and adrenal glands are
unremarkable.

There is moderate to severe right-sided hydronephrosis, with diffuse
prominence of the right ureter along its entire course. An
obstructing 8 x 6 mm stone is noted in the distal right ureter,
approximately 1 cm proximal to the right vesicoureteral junction.
Mild right-sided perinephric stranding is seen.

A nonobstructing 3 mm stone is noted at the interpole region of the
right kidney. Evaluation for left-sided renal stones is limited due
to trace contrast within the left renal calyces.

No free fluid is identified. The small bowel is unremarkable in
appearance. The stomach is within normal limits. No acute vascular
abnormalities are seen.

The appendix is normal in caliber, without evidence for
appendicitis. The colon is unremarkable in appearance.

The bladder is decompressed and not well assessed. The prostate
remains normal in size. No inguinal lymphadenopathy is seen.

No acute osseous abnormalities are identified.
IMPRESSION: 1. Moderate to severe right-sided hydronephrosis, with diffuse
prominence of the right ureter. Obstructing 8 x 6 mm stone noted in
the distal right ureter, 1 cm proximal to the right vesicoureteral
junction.
2. Nonobstructing 3 mm stone noted at the interpole region of the
right kidney.

## 2016-09-23 DIAGNOSIS — J3489 Other specified disorders of nose and nasal sinuses: Secondary | ICD-10-CM | POA: Diagnosis not present

## 2016-09-23 DIAGNOSIS — R05 Cough: Secondary | ICD-10-CM | POA: Diagnosis not present

## 2016-09-23 DIAGNOSIS — S46911A Strain of unspecified muscle, fascia and tendon at shoulder and upper arm level, right arm, initial encounter: Secondary | ICD-10-CM | POA: Diagnosis not present

## 2016-09-23 DIAGNOSIS — J069 Acute upper respiratory infection, unspecified: Secondary | ICD-10-CM | POA: Diagnosis not present

## 2017-01-29 ENCOUNTER — Other Ambulatory Visit: Payer: Self-pay | Admitting: Pediatrics

## 2017-05-06 LAB — LIPID PANEL
Cholesterol: 210 — AB (ref 0–200)
HDL: 21 — AB (ref 35–70)
Triglycerides: 999 — AB (ref 40–160)

## 2017-05-06 LAB — HEPATIC FUNCTION PANEL
ALK PHOS: 86 (ref 25–125)
ALT: 44 — AB (ref 10–40)
AST: 33 (ref 14–40)
BILIRUBIN, TOTAL: 0.2

## 2017-05-06 LAB — BASIC METABOLIC PANEL
BUN: 11 (ref 4–21)
CREATININE: 1 (ref 0.6–1.3)
GLUCOSE: 101
Potassium: 4.2 (ref 3.4–5.3)
Sodium: 145 (ref 137–147)

## 2017-05-06 LAB — CBC AND DIFFERENTIAL
HCT: 44 (ref 41–53)
Hemoglobin: 15.9 (ref 13.5–17.5)
Platelets: 176 (ref 150–399)
WBC: 9.1

## 2017-05-14 ENCOUNTER — Encounter: Payer: Self-pay | Admitting: Adult Health

## 2017-05-14 ENCOUNTER — Other Ambulatory Visit: Payer: Self-pay | Admitting: Family Medicine

## 2017-05-14 ENCOUNTER — Ambulatory Visit (INDEPENDENT_AMBULATORY_CARE_PROVIDER_SITE_OTHER): Payer: BLUE CROSS/BLUE SHIELD | Admitting: Adult Health

## 2017-05-14 VITALS — BP 132/88 | Temp 97.9°F | Ht 69.0 in | Wt 238.0 lb

## 2017-05-14 DIAGNOSIS — Z23 Encounter for immunization: Secondary | ICD-10-CM

## 2017-05-14 DIAGNOSIS — Z0000 Encounter for general adult medical examination without abnormal findings: Secondary | ICD-10-CM

## 2017-05-14 DIAGNOSIS — E7889 Other lipoprotein metabolism disorders: Secondary | ICD-10-CM

## 2017-05-14 LAB — POC URINALSYSI DIPSTICK (AUTOMATED)
Bilirubin, UA: NEGATIVE
Blood, UA: NEGATIVE
CLARITY UA: NEGATIVE
GLUCOSE UA: NEGATIVE
Ketones, UA: NEGATIVE
LEUKOCYTES UA: NEGATIVE
NITRITE UA: NEGATIVE
Protein, UA: NEGATIVE
Urobilinogen, UA: 0.2 E.U./dL
pH, UA: 6 (ref 5.0–8.0)

## 2017-05-14 LAB — CBC WITH DIFFERENTIAL/PLATELET
BASOS PCT: 0.7 % (ref 0.0–3.0)
Basophils Absolute: 0.1 10*3/uL (ref 0.0–0.1)
EOS PCT: 4.3 % (ref 0.0–5.0)
Eosinophils Absolute: 0.3 10*3/uL (ref 0.0–0.7)
HEMATOCRIT: 45.7 % (ref 39.0–52.0)
HEMOGLOBIN: 15.8 g/dL (ref 13.0–17.0)
Lymphocytes Relative: 34.3 % (ref 12.0–46.0)
Lymphs Abs: 2.5 10*3/uL (ref 0.7–4.0)
MCHC: 34.6 g/dL (ref 30.0–36.0)
MCV: 84.9 fl (ref 78.0–100.0)
MONO ABS: 0.6 10*3/uL (ref 0.1–1.0)
MONOS PCT: 8.5 % (ref 3.0–12.0)
Neutro Abs: 3.9 10*3/uL (ref 1.4–7.7)
Neutrophils Relative %: 52.2 % (ref 43.0–77.0)
Platelets: 181 10*3/uL (ref 150.0–400.0)
RBC: 5.38 Mil/uL (ref 4.22–5.81)
RDW: 13.3 % (ref 11.5–15.5)
WBC: 7.4 10*3/uL (ref 4.0–10.5)

## 2017-05-14 LAB — HEPATIC FUNCTION PANEL
ALBUMIN: 4.4 g/dL (ref 3.5–5.2)
ALT: 223 U/L — ABNORMAL HIGH (ref 0–53)
AST: 131 U/L — AB (ref 0–37)
Alkaline Phosphatase: 69 U/L (ref 39–117)
BILIRUBIN TOTAL: 0.3 mg/dL (ref 0.2–1.2)
Bilirubin, Direct: 0.1 mg/dL (ref 0.0–0.3)
TOTAL PROTEIN: 7.7 g/dL (ref 6.0–8.3)

## 2017-05-14 LAB — BASIC METABOLIC PANEL
BUN: 11 mg/dL (ref 6–23)
CHLORIDE: 105 meq/L (ref 96–112)
CO2: 25 mEq/L (ref 19–32)
Calcium: 9.6 mg/dL (ref 8.4–10.5)
Creatinine, Ser: 0.91 mg/dL (ref 0.40–1.50)
GFR: 128.57 mL/min (ref >60.00)
Glucose, Bld: 94 mg/dL (ref 70–99)
Potassium: 4 mEq/L (ref 3.5–5.1)
SODIUM: 138 meq/L (ref 135–145)

## 2017-05-14 LAB — LIPID PANEL
Cholesterol: 168 mg/dL (ref 0–200)
HDL: 24.4 mg/dL — ABNORMAL LOW (ref >39.00)
NonHDL: 143.91
Total CHOL/HDL Ratio: 7
Triglycerides: 400 mg/dL — ABNORMAL HIGH (ref 0.0–149.0)
VLDL: 80 mg/dL — ABNORMAL HIGH (ref 0.0–40.0)

## 2017-05-14 LAB — TSH: TSH: 2.09 u[IU]/mL (ref 0.35–4.50)

## 2017-05-14 LAB — LDL CHOLESTEROL, DIRECT: LDL DIRECT: 77 mg/dL

## 2017-05-14 NOTE — Progress Notes (Signed)
Patient presents to clinic today to establish care. He is a pleasant 26 year old male who  has no past medical history on file.   Acute Concerns: Establish Care/CPE  Chronic Issues:  None   Health Maintenance: Dental -- Does not do routine care  Vision -- Doe snot do routine care Immunizations -- Needs Tdap  Diet: Is working on a heart healthy diet.  Exercise: He has gotten away from exercising and has gained 15 pounds over the last 4 months    No past medical history on file.  Past Surgical History:  Procedure Laterality Date  . CYSTOSCOPY WITH URETEROSCOPY AND STENT PLACEMENT Right 02/08/2014   Procedure: CYSTOSCOPY WITH RIGHT RETROGRADE PYELOGRAM RIGHT  URETEROSCOPY WITH LASER LITHOTRIPSY  BASKET EXTRACTION AND RIGHT STENT PLACEMENT;  Surgeon: Crist FatBenjamin W Herrick, MD;  Location: [facility];  Service: Urology;  Laterality: Right;    No current outpatient prescriptions on file prior to visit.   No current facility-administered medications on file prior to visit.     No Known Allergies  Family History  Problem Relation Age of Onset  . Diabetes Maternal Grandmother   . Diabetes Maternal Uncle     Social History   Social History  . Marital status: Married    Spouse name: N/A  . Number of children: N/A  . Years of education: N/A   Occupational History  . Not on file.   Social History Main Topics  . Smoking status: Never Smoker  . Smokeless tobacco: Never Used  . Alcohol use Yes     Comment: socially  . Drug use: No  . Sexual activity: Not on file   Other Topics Concern  . Not on file   Social History Narrative   He works as a Psychologist, sport and exercisedelivery driver    Engaged    No kids           Review of Systems  Constitutional: Negative.   HENT: Negative.   Eyes: Negative.   Respiratory: Negative.   Cardiovascular: Negative.   Gastrointestinal: Negative.   Musculoskeletal: Negative.   Skin: Negative.   Neurological: Negative.   Endo/Heme/Allergies: Negative.     Psychiatric/Behavioral: Negative.     BP 132/88 (BP Location: Left Arm)   Temp 97.9 F (36.6 C) (Oral)   Ht 5\' 9"  (1.753 m)   Wt 238 lb (108 kg)   BMI 35.15 kg/m   Physical Exam  Constitutional: He is oriented to person, place, and time and well-developed, well-nourished, and in no distress. No distress.  HENT:  Head: Normocephalic and atraumatic.  Right Ear: External ear normal.  Left Ear: External ear normal.  Nose: Nose normal.  Mouth/Throat: Oropharynx is clear and moist. No oropharyngeal exudate.  Eyes: Pupils are equal, round, and reactive to light. Conjunctivae and EOM are normal. Right eye exhibits no discharge. Left eye exhibits no discharge. No scleral icterus.  Neck: Normal range of motion. Neck supple. No JVD present. No tracheal deviation present. No thyromegaly present.  Cardiovascular: Normal rate, regular rhythm, normal heart sounds and intact distal pulses.  Exam reveals no gallop and no friction rub.   No murmur heard. Pulmonary/Chest: Effort normal and breath sounds normal. No stridor. No respiratory distress. He has no wheezes. He has no rales. He exhibits no tenderness.  Abdominal: Soft. Bowel sounds are normal. He exhibits no distension and no mass. There is no tenderness. There is no rebound and no guarding.  Genitourinary:  Genitourinary Comments: Referred   Musculoskeletal: Normal  range of motion. He exhibits no edema, tenderness or deformity.  Lymphadenopathy:    He has no cervical adenopathy.  Neurological: He is alert and oriented to person, place, and time. He displays normal reflexes. No cranial nerve deficit. He exhibits normal muscle tone. Gait normal. Coordination normal. GCS score is 15.  Skin: Skin is warm and dry. No rash noted. He is not diaphoretic. No erythema. No pallor.  Psychiatric: Mood, memory, affect and judgment normal.  Nursing note and vitals reviewed.   Assessment/Plan:  1. Routine general medical examination at a health care  facility - Educated on the importance of diet and exercise. I would like him to lose 10-15 pounds  - Tdap given in the office today  - Basic metabolic panel - CBC with Differential/Platelet - Hepatic function panel - Lipid panel - TSH - POCT Urinalysis Dipstick (Automated) - Follow up in one year or sooner if needed  Shirline Freesory Maitri Schnoebelen, NP

## 2017-05-14 NOTE — Patient Instructions (Signed)
It was great meeting you today   I will follow up with you regarding your blood work   Start exercising on a regular basis - start slow and build up   Follow up in one year or sooner if needed  Health Maintenance, Male A healthy lifestyle and preventative care can promote health and wellness.  Maintain regular health, dental, and eye exams.  Eat a healthy diet. Foods like vegetables, fruits, whole grains, low-fat dairy products, and lean protein foods contain the nutrients you need and are low in calories. Decrease your intake of foods high in solid fats, added sugars, and salt. Get information about a proper diet from your health care provider, if necessary.  Regular physical exercise is one of the most important things you can do for your health. Most adults should get at least 150 minutes of moderate-intensity exercise (any activity that increases your heart rate and causes you to sweat) each week. In addition, most adults need muscle-strengthening exercises on 2 or more days a week.   Maintain a healthy weight. The body mass index (BMI) is a screening tool to identify possible weight problems. It provides an estimate of body fat based on height and weight. Your health care provider can find your BMI and can help you achieve or maintain a healthy weight. For males 20 years and older:  A BMI below 18.5 is considered underweight.  A BMI of 18.5 to 24.9 is normal.  A BMI of 25 to 29.9 is considered overweight.  A BMI of 30 and above is considered obese.  Maintain normal blood lipids and cholesterol by exercising and minimizing your intake of saturated fat. Eat a balanced diet with plenty of fruits and vegetables. Blood tests for lipids and cholesterol should begin at age 27 and be repeated every 5 years. If your lipid or cholesterol levels are high, you are over age 27, or you are at high risk for heart disease, you may need your cholesterol levels checked more frequently.Ongoing high  lipid and cholesterol levels should be treated with medicines if diet and exercise are not working.  If you smoke, find out from your health care provider how to quit. If you do not use tobacco, do not start.  Lung cancer screening is recommended for adults aged 55-80 years who are at high risk for developing lung cancer because of a history of smoking. A yearly low-dose CT scan of the lungs is recommended for people who have at least a 30-pack-year history of smoking and are current smokers or have quit within the past 15 years. A pack year of smoking is smoking an average of 1 pack of cigarettes a day for 1 year (for example, a 30-pack-year history of smoking could mean smoking 1 pack a day for 30 years or 2 packs a day for 15 years). Yearly screening should continue until the smoker has stopped smoking for at least 15 years. Yearly screening should be stopped for people who develop a health problem that would prevent them from having lung cancer treatment.  If you choose to drink alcohol, do not have more than 2 drinks per day. One drink is considered to be 12 oz (360 mL) of beer, 5 oz (150 mL) of wine, or 1.5 oz (45 mL) of liquor.  Avoid the use of street drugs. Do not share needles with anyone. Ask for help if you need support or instructions about stopping the use of drugs.  High blood pressure causes heart disease and  increases the risk of stroke. High blood pressure is more likely to develop in:  People who have blood pressure in the end of the normal range (100-139/85-89 mm Hg).  People who are overweight or obese.  People who are African American.  If you are 60-62 years of age, have your blood pressure checked every 3-5 years. If you are 51 years of age or older, have your blood pressure checked every year. You should have your blood pressure measured twice--once when you are at a hospital or clinic, and once when you are not at a hospital or clinic. Record the average of the two  measurements. To check your blood pressure when you are not at a hospital or clinic, you can use:  An automated blood pressure machine at a pharmacy.  A home blood pressure monitor.  If you are 1-33 years old, ask your health care provider if you should take aspirin to prevent heart disease.  Diabetes screening involves taking a blood sample to check your fasting blood sugar level. This should be done once every 3 years after age 61 if you are at a normal weight and without risk factors for diabetes. Testing should be considered at a younger age or be carried out more frequently if you are overweight and have at least 1 risk factor for diabetes.  Colorectal cancer can be detected and often prevented. Most routine colorectal cancer screening begins at the age of 14 and continues through age 10. However, your health care provider may recommend screening at an earlier age if you have risk factors for colon cancer. On a yearly basis, your health care provider may provide home test kits to check for hidden blood in the stool. A small camera at the end of a tube may be used to directly examine the colon (sigmoidoscopy or colonoscopy) to detect the earliest forms of colorectal cancer. Talk to your health care provider about this at age 87 when routine screening begins. A direct exam of the colon should be repeated every 5-10 years through age 60, unless early forms of precancerous polyps or small growths are found.  People who are at an increased risk for hepatitis B should be screened for this virus. You are considered at high risk for hepatitis B if:  You were born in a country where hepatitis B occurs often. Talk with your health care provider about which countries are considered high risk.  Your parents were born in a high-risk country and you have not received a shot to protect against hepatitis B (hepatitis B vaccine).  You have HIV or AIDS.  You use needles to inject street drugs.  You live  with, or have sex with, someone who has hepatitis B.  You are a man who has sex with other men (MSM).  You get hemodialysis treatment.  You take certain medicines for conditions like cancer, organ transplantation, and autoimmune conditions.  Hepatitis C blood testing is recommended for all people born from [birth year] through 1966 and any individual with known risk factors for hepatitis C.  Healthy men should no longer receive prostate-specific antigen (PSA) blood tests as part of routine cancer screening. Talk to your health care provider about prostate cancer screening.  Testicular cancer screening is not recommended for adolescents or adult males who have no symptoms. Screening includes self-exam, a health care provider exam, and other screening tests. Consult with your health care provider about any symptoms you have or any concerns you have about testicular cancer.  Practice safe sex. Use condoms and avoid high-risk sexual practices to reduce the spread of sexually transmitted infections (STIs).  You should be screened for STIs, including gonorrhea and chlamydia if:  You are sexually active and are younger than 24 years.  You are older than 24 years, and your health care provider tells you that you are at risk for this type of infection.  Your sexual activity has changed since you were last screened, and you are at an increased risk for chlamydia or gonorrhea. Ask your health care provider if you are at risk.  If you are at risk of being infected with HIV, it is recommended that you take a prescription medicine daily to prevent HIV infection. This is called pre-exposure prophylaxis (PrEP). You are considered at risk if:  You are a man who has sex with other men (MSM).  You are a heterosexual man who is sexually active with multiple partners.  You take drugs by injection.  You are sexually active with a partner who has HIV.  Talk with your health care provider about whether you are at  high risk of being infected with HIV. If you choose to begin PrEP, you should first be tested for HIV. You should then be tested every 3 months for as long as you are taking PrEP.  Use sunscreen. Apply sunscreen liberally and repeatedly throughout the day. You should seek shade when your shadow is shorter than you. Protect yourself by wearing long sleeves, pants, a wide-brimmed hat, and sunglasses year round whenever you are outdoors.  Tell your health care provider of new moles or changes in moles, especially if there is a change in shape or color. Also, tell your health care provider if a mole is larger than the size of a pencil eraser.  A one-time screening for abdominal aortic aneurysm (AAA) and surgical repair of large AAAs by ultrasound is recommended for men aged 72-75 years who are current or former smokers.  Stay current with your vaccines (immunizations).   This information is not intended to replace advice given to you by your health care provider. Make sure you discuss any questions you have with your health care provider.   Document Released: 03/28/2008 Document Revised: 10/21/2014 Document Reviewed: 02/25/2011 Elsevier Interactive Patient Education [employer].

## 2017-05-23 ENCOUNTER — Ambulatory Visit
Admission: RE | Admit: 2017-05-23 | Discharge: 2017-05-23 | Disposition: A | Discharge status: Home / Self Care | Payer: BLUE CROSS/BLUE SHIELD | Source: Ambulatory Visit | Attending: Adult Health | Admitting: Adult Health

## 2017-05-23 DIAGNOSIS — K824 Cholesterolosis of gallbladder: Secondary | ICD-10-CM | POA: Diagnosis not present

## 2017-05-23 DIAGNOSIS — E7889 Other lipoprotein metabolism disorders: Secondary | ICD-10-CM

## 2017-05-28 ENCOUNTER — Encounter: Payer: Self-pay | Admitting: Family Medicine

## 2017-07-04 ENCOUNTER — Encounter: Payer: Self-pay | Admitting: Adult Health

## 2017-08-15 ENCOUNTER — Ambulatory Visit: Payer: Self-pay | Admitting: Adult Health

## 2017-10-19 ENCOUNTER — Encounter: Payer: Self-pay | Admitting: Adult Health

## 2017-10-20 ENCOUNTER — Encounter: Payer: Self-pay | Admitting: Family Medicine

## 2017-10-20 ENCOUNTER — Ambulatory Visit: Payer: BLUE CROSS/BLUE SHIELD | Admitting: Family Medicine

## 2017-10-20 VITALS — BP 130/88 | HR 66 | Temp 98.1°F | Wt 243.0 lb

## 2017-10-20 DIAGNOSIS — N200 Calculus of kidney: Secondary | ICD-10-CM | POA: Diagnosis not present

## 2017-10-20 LAB — POC URINALSYSI DIPSTICK (AUTOMATED)
BILIRUBIN UA: NEGATIVE
Blood, UA: NEGATIVE
GLUCOSE UA: NEGATIVE
Ketones, UA: NEGATIVE
LEUKOCYTES UA: NEGATIVE
Nitrite, UA: NEGATIVE
Protein, UA: NEGATIVE
Spec Grav, UA: 1.02 (ref 1.010–1.025)
Urobilinogen, UA: NEGATIVE E.U./dL — AB
pH, UA: 6 (ref 5.0–8.0)

## 2017-10-20 MED ORDER — HYDROCODONE-ACETAMINOPHEN 10-325 MG PO TABS: 1.0000 | ORAL_TABLET | Freq: Three times a day (TID) | ORAL | 0 refills | Status: DC | PRN

## 2017-10-20 NOTE — Telephone Encounter (Signed)
Called patient, he has h/o kidney stone 3 years ago and feels he is dealing with the same thing now. He reports abd cramping and increased urination for 2.5 weeks that has acutely worsened over the last 2 days. He is also having some nausea. He would like to be seen today. Appt scheduled w/ Dr. Tawanna Coolerodd in PCP's absence.

## 2017-10-20 NOTE — Progress Notes (Signed)
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 Last about 3 hours and went away. It did not radiate. He had no other symptoms at the time. His pain at that juncture was a 7 on a scale of 1-10. 2 days ago he had the recurrence of the same pain and lasted about 6-7 hours that time. He took some OTC Excedrin and it didn't help. He finally went to sleep and the pain dissipated. At that time he noted year he noted urinary frequency.  Historically in 2015 he had a lithotripsy with a right stent placed by urology because of the obstructive uropathy. He says this is the same type of pain he had in 2015.  BP 130/88 (BP Location: Left Arm, Patient Position: Sitting, Cuff Size: Normal)   Pulse 66   Temp 98.1 F (36.7 C) (Oral)   Wt 243 lb (110.2 kg)   BMI 35.88 kg/m  Well-developed well-nourished male no acute distress completely pain-free now  Urinalysis was completely normal  #1 right-sided flank pain consistent with kidney stone.......... he probably has a large stone up in the kidney that's not able to pass. Therefore Anthony Gaines get him to see his urologist ASAP for imaging studies etc.

## 2017-10-20 NOTE — Patient Instructions (Signed)
Drink lots of water  Call your urologist and make an appointment to be seen ASAP. We will help you facilitate that referral  If you have any recurrent episodes of severe pain prior to your appointment with urologist [facility]

## 2017-10-20 NOTE — Addendum Note (Signed)
Addended by: Carola RhineKIGOTHO, Ching Rabideau N on: 10/20/2017 04:41 PM   Modules accepted: Orders

## 2018-05-06 LAB — HEPATIC FUNCTION PANEL
ALT: 82 — AB (ref 10–40)
AST: 40 (ref 14–40)
Alkaline Phosphatase: 81 (ref 25–125)
Bilirubin, Total: 0.4

## 2018-05-06 LAB — CBC AND DIFFERENTIAL
HEMATOCRIT: 46 (ref 41–53)
Hemoglobin: 16.1 (ref 13.5–17.5)
Platelets: 168 (ref 150–399)
WBC: 7.7

## 2018-05-06 LAB — BASIC METABOLIC PANEL
BUN: 9 (ref 4–21)
Creatinine: 1 (ref 0.6–1.3)
Glucose: 91
Potassium: 3.8 (ref 3.4–5.3)
SODIUM: 141 (ref 137–147)

## 2018-05-06 LAB — HEMOGLOBIN A1C: Hemoglobin A1C: 5.3

## 2018-05-06 LAB — LIPID PANEL
Cholesterol: 159 (ref 0–200)
HDL: 24 — AB (ref 35–70)
LDL Cholesterol: 75
TRIGLYCERIDES: 301 — AB (ref 40–160)

## 2018-05-20 ENCOUNTER — Encounter: Payer: Self-pay | Admitting: Family Medicine

## 2018-06-04 ENCOUNTER — Encounter: Payer: Self-pay | Admitting: *Deleted

## 2018-06-16 ENCOUNTER — Encounter: Payer: Self-pay | Admitting: Adult Health

## 2018-06-16 ENCOUNTER — Other Ambulatory Visit: Payer: Self-pay | Admitting: Adult Health

## 2018-06-16 MED ORDER — FENOFIBRATE 48 MG PO TABS: 48.0000 mg | ORAL_TABLET | Freq: Every day | ORAL | 3 refills | Status: DC

## 2018-07-01 ENCOUNTER — Encounter: Payer: Self-pay | Admitting: Adult Health

## 2018-07-03 ENCOUNTER — Encounter: Payer: Self-pay | Admitting: Adult Health

## 2018-07-03 ENCOUNTER — Ambulatory Visit: Payer: BLUE CROSS/BLUE SHIELD | Admitting: Adult Health

## 2018-07-03 VITALS — BP 128/90 | HR 74 | Temp 98.3°F | Wt 245.0 lb

## 2018-07-03 DIAGNOSIS — K2100 Gastro-esophageal reflux disease with esophagitis, without bleeding: Secondary | ICD-10-CM

## 2018-07-03 DIAGNOSIS — E782 Mixed hyperlipidemia: Secondary | ICD-10-CM | POA: Diagnosis not present

## 2018-07-03 NOTE — Progress Notes (Signed)
Subjective:    Patient ID: Anthony Gaines, male    DOB: 1991/07/07, 27 y.o.   MRN: [id number]  HPI  27 year old male who  has a past medical history of History of kidney stones (2015). He presents to the office today for three month follow up regarding elevated triglycerides. He had labs done at his work in July which showed triglyceride levels at 300. He was prescribed tricor but never started this medication. He would like to retest his labs prior to starting his medications.   He has cut back on drinking and is working on diet and exercise.   Additionally, he reports increased acid reflux pain. Has noticed that pain is worse after he eats a spicy or acidic foods. Denies waking up with a sour taste in his mouth. Increased burping. Denies abdominal pain, chest pressure, nausea or vomiting.   Review of Systems See HPI   Past Medical History:  Diagnosis Date  . History of kidney stones 2015    Social History   Socioeconomic History  . Marital status: Single    Spouse name: Not on file  . Number of children: Not on file  . Years of education: Not on file  . Highest education level: Not on file  Occupational History  . Not on file  Social Needs  . Financial resource strain: Not on file  . Food insecurity:    Worry: Not on file    Inability: Not on file  . Transportation needs:    Medical: Not on file    Non-medical: Not on file  Tobacco Use  . Smoking status: Never Smoker  . Smokeless tobacco: Never Used  Substance and Sexual Activity  . Alcohol use: Yes    Comment: socially  . Drug use: No  . Sexual activity: Not on file  Lifestyle  . Physical activity:    Days per week: Not on file    Minutes per session: Not on file  . Stress: Not on file  Relationships  . Social connections:    Talks on phone: Not on file    Gets together: Not on file    Attends religious service: Not on file    Active member of club or organization: Not on file    Attends meetings of  clubs or organizations: Not on file    Relationship status: Not on file  . Intimate partner violence:    Fear of current or ex partner: Not on file    Emotionally abused: Not on file    Physically abused: Not on file    Forced sexual activity: Not on file  Other Topics Concern  . Not on file  Social History Narrative   He works as a Psychologist, sport and exercisedelivery driver    Engaged    No kids        Past Surgical History:  Procedure Laterality Date  . CYSTOSCOPY WITH URETEROSCOPY AND STENT PLACEMENT Right 02/08/2014   Procedure: CYSTOSCOPY WITH RIGHT RETROGRADE PYELOGRAM RIGHT  URETEROSCOPY WITH LASER LITHOTRIPSY  BASKET EXTRACTION AND RIGHT STENT PLACEMENT;  Surgeon: Crist FatBenjamin W Herrick, MD;  Location: [facility];  Service: Urology;  Laterality: Right;    Family History  Problem Relation Age of Onset  . Diabetes Maternal Grandmother   . Diabetes Maternal Uncle     No Known Allergies  Current Outpatient Medications on File Prior to Visit  Medication Sig Dispense Refill  . fenofibrate (TRICOR) 48 MG tablet Take 1 tablet (48 mg total) by  mouth daily. 90 tablet 3   No current facility-administered medications on file prior to visit.     BP 128/90 (BP Location: Left Arm, Patient Position: Sitting, Cuff Size: Large)   Pulse 74   Temp 98.3 F (36.8 C) (Oral)   Wt 245 lb (111.1 kg)   SpO2 97%   BMI 36.18 kg/m       Objective:   Physical Exam  Constitutional: He is oriented to person, place, and time. He appears well-developed and well-nourished. No distress.  Cardiovascular: Normal rate, regular rhythm, normal heart sounds and intact distal pulses.  Pulmonary/Chest: Effort normal and breath sounds normal.  Neurological: He is alert and oriented to person, place, and time.  Skin: Skin is warm and dry. He is not diaphoretic.  Psychiatric: He has a normal mood and affect. His behavior is normal. Judgment and thought content normal.  Nursing note and vitals reviewed.     Assessment & Plan:  1.  Elevated cholesterol with elevated triglycerides - Will repeat labs and likely have him start Tricor  - Lipid panel - Hepatic function panel  2. Gastroesophageal reflux disease with esophagitis - Encouraged to try and eat less acidic foods.  - Can use OTC Prilosec x 2 weeks then stop  - Follow up if symptoms persist   Shirline Frees, NP

## 2019-09-01 ENCOUNTER — Encounter: Payer: Self-pay | Admitting: Adult Health

## 2019-09-01 ENCOUNTER — Ambulatory Visit: Payer: BC Managed Care – PPO | Admitting: Adult Health

## 2019-09-01 ENCOUNTER — Other Ambulatory Visit: Payer: Self-pay

## 2019-09-01 VITALS — BP 130/80 | Temp 97.7°F | Wt 250.0 lb

## 2019-09-01 DIAGNOSIS — R6889 Other general symptoms and signs: Secondary | ICD-10-CM | POA: Diagnosis not present

## 2019-09-01 DIAGNOSIS — Z0001 Encounter for general adult medical examination with abnormal findings: Secondary | ICD-10-CM

## 2019-09-01 DIAGNOSIS — Z0000 Encounter for general adult medical examination without abnormal findings: Secondary | ICD-10-CM

## 2019-09-01 DIAGNOSIS — E860 Dehydration: Secondary | ICD-10-CM | POA: Diagnosis not present

## 2019-09-01 DIAGNOSIS — E785 Hyperlipidemia, unspecified: Secondary | ICD-10-CM | POA: Diagnosis not present

## 2019-09-01 DIAGNOSIS — R748 Abnormal levels of other serum enzymes: Secondary | ICD-10-CM

## 2019-09-01 LAB — COMPREHENSIVE METABOLIC PANEL
ALT: 66 U/L — ABNORMAL HIGH (ref 0–53)
AST: 39 U/L — ABNORMAL HIGH (ref 0–37)
Albumin: 4.7 g/dL (ref 3.5–5.2)
Alkaline Phosphatase: 74 U/L (ref 39–117)
BUN: 12 mg/dL (ref 6–23)
CO2: 26 mEq/L (ref 19–32)
Calcium: 9.7 mg/dL (ref 8.4–10.5)
Chloride: 102 mEq/L (ref 96–112)
Creatinine, Ser: 0.9 mg/dL (ref 0.40–1.50)
GFR: 120.5 mL/min (ref >60.00)
Glucose, Bld: 78 mg/dL (ref 70–99)
Potassium: 4 mEq/L (ref 3.5–5.1)
Sodium: 138 mEq/L (ref 135–145)
Total Bilirubin: 0.6 mg/dL (ref 0.2–1.2)
Total Protein: 7.9 g/dL (ref 6.0–8.3)

## 2019-09-01 LAB — POCT URINALYSIS DIPSTICK
Glucose, UA: NEGATIVE
Leukocytes, UA: NEGATIVE
Protein, UA: NEGATIVE
Spec Grav, UA: 1.025 (ref 1.010–1.025)
Urobilinogen, UA: 0.2 E.U./dL
pH, UA: 6.5 (ref 5.0–8.0)

## 2019-09-01 LAB — CBC WITH DIFFERENTIAL/PLATELET
Basophils Absolute: 0.1 10*3/uL (ref 0.0–0.1)
Basophils Relative: 0.6 % (ref 0.0–3.0)
Eosinophils Absolute: 0.2 10*3/uL (ref 0.0–0.7)
Eosinophils Relative: 2 % (ref 0.0–5.0)
HCT: 44.8 % (ref 39.0–52.0)
Hemoglobin: 15.6 g/dL (ref 13.0–17.0)
Lymphocytes Relative: 31.3 % (ref 12.0–46.0)
Lymphs Abs: 3.1 10*3/uL (ref 0.7–4.0)
MCHC: 34.7 g/dL (ref 30.0–36.0)
MCV: 83.1 fl (ref 78.0–100.0)
Monocytes Absolute: 0.8 10*3/uL (ref 0.1–1.0)
Monocytes Relative: 8.6 % (ref 3.0–12.0)
Neutro Abs: 5.7 10*3/uL (ref 1.4–7.7)
Neutrophils Relative %: 57.5 % (ref 43.0–77.0)
Platelets: 150 10*3/uL (ref 150.0–400.0)
RBC: 5.4 Mil/uL (ref 4.22–5.81)
RDW: 13.4 % (ref 11.5–15.5)
WBC: 9.9 10*3/uL (ref 4.0–10.5)

## 2019-09-01 LAB — LIPID PANEL
Cholesterol: 189 mg/dL (ref 0–200)
HDL: 28 mg/dL — ABNORMAL LOW (ref >39.00)
LDL Cholesterol: 133 mg/dL — ABNORMAL HIGH (ref 0–99)
NonHDL: 160.69
Total CHOL/HDL Ratio: 7
Triglycerides: 140 mg/dL (ref 0.0–149.0)
VLDL: 28 mg/dL (ref 0.0–40.0)

## 2019-09-01 LAB — HEMOGLOBIN A1C: Hgb A1c MFr Bld: 5.3 % (ref 4.6–6.5)

## 2019-09-01 LAB — CK: Total CK: 681 U/L — ABNORMAL HIGH (ref 7–232)

## 2019-09-01 LAB — MAGNESIUM: Magnesium: 1.9 mg/dL (ref 1.5–2.5)

## 2019-09-01 LAB — TSH: TSH: 1.32 u[IU]/mL (ref 0.35–4.50)

## 2019-09-01 NOTE — Progress Notes (Signed)
Subjective:    Patient ID: Anthony Gaines, male    DOB: 03-04-1991, 28 y.o.   MRN: [id number]  HPI Patient presents for yearly preventative medicine examination. He is a pleasant 28 year old male who  has a past medical history of History of kidney stones (2015).  Dislipidemia - was prescribed Tricor in September 2019. He is unsure if he ever took this medication.   Dehydration - He presents to the office today for concern of muscle cramps and muscle fatigue. He first noticed this about a month after started weight training again ( after an 8 month refrain) and developed cramping in his lower extremities ( bilateral thighs). He took off a few weeks from heavy weights and focuses more on low weight and aerobic exercise. When he started doing this he felt " weak and stiff when doing upper body exercises, I used to be able to do at least 20 pushups but then I could only do 8 before feeling fatigued). During this time he reports that his urine was darker and foamy despite drinking at least a gallon of water a day. His symptoms lasted until last week when they resolved. He denies brown urine, eating a high protein diet or having trouble with ambulation.   All immunizations and health maintenance protocols were reviewed with the patient and needed orders were placed. Refuses flu vaccination   Appropriate screening laboratory values were ordered for the patient including screening of hyperlipidemia, renal function and hepatic function.  Medication reconciliation,  past medical history, social history, problem list and allergies were reviewed in detail with the patient  Goals were established with regard to weight loss, exercise, and  diet in compliance with medications. See above.     Review of Systems  Constitutional: Negative.   HENT: Negative.   Eyes: Negative.   Respiratory: Negative.   Cardiovascular: Negative.   Gastrointestinal: Negative.   Endocrine: Negative.   Genitourinary:  Negative.   Musculoskeletal: Negative.   Skin: Negative.   Allergic/Immunologic: Negative.   Neurological: Negative.   Hematological: Negative.   All other systems reviewed and are negative.  Past Medical History:  Diagnosis Date  . History of kidney stones 2015    Social History   Socioeconomic History  . Marital status: Married    Spouse name: Not on file  . Number of children: Not on file  . Years of education: Not on file  . Highest education level: Not on file  Occupational History  . Not on file  Social Needs  . Financial resource strain: Not on file  . Food insecurity    Worry: Not on file    Inability: Not on file  . Transportation needs    Medical: Not on file    Non-medical: Not on file  Tobacco Use  . Smoking status: Never Smoker  . Smokeless tobacco: Never Used  Substance and Sexual Activity  . Alcohol use: Yes    Comment: socially  . Drug use: No  . Sexual activity: Not on file  Lifestyle  . Physical activity    Days per week: Not on file    Minutes per session: Not on file  . Stress: Not on file  Relationships  . Social Herbalist on phone: Not on file    Gets together: Not on file    Attends religious service: Not on file    Active member of club or organization: Not on file    Attends meetings  of clubs or organizations: Not on file    Relationship status: Not on file  . Intimate partner violence    Fear of current or ex partner: Not on file    Emotionally abused: Not on file    Physically abused: Not on file    Forced sexual activity: Not on file  Other Topics Concern  . Not on file  Social History Narrative   He works as a Psychologist, sport and exercisedelivery driver    Engaged    No kids        Past Surgical History:  Procedure Laterality Date  . CYSTOSCOPY WITH URETEROSCOPY AND STENT PLACEMENT Right 02/08/2014   Procedure: CYSTOSCOPY WITH RIGHT RETROGRADE PYELOGRAM RIGHT  URETEROSCOPY WITH LASER LITHOTRIPSY  BASKET EXTRACTION AND RIGHT STENT  PLACEMENT;  Surgeon: Crist FatBenjamin W Herrick, MD;  Location: [facility];  Service: Urology;  Laterality: Right;    Family History  Problem Relation Age of Onset  . Diabetes Maternal Grandmother   . Diabetes Maternal Uncle     No Known Allergies  No current outpatient medications on file prior to visit.   No current facility-administered medications on file prior to visit.     BP 130/80   Temp 97.7 F (36.5 C)   Wt 250 lb (113.4 kg)   BMI 36.92 kg/m       Objective:   Physical Exam Vitals signs and nursing note reviewed.  Constitutional:      General: He is not in acute distress.    Appearance: Normal appearance. He is well-developed and overweight. He is not ill-appearing, toxic-appearing or diaphoretic.  HENT:     Head: Normocephalic and atraumatic.     Right Ear: Tympanic membrane, ear canal and external ear normal. There is no impacted cerumen.     Left Ear: Tympanic membrane, ear canal and external ear normal. There is no impacted cerumen.     Nose: Nose normal. No congestion or rhinorrhea.     Mouth/Throat:     Mouth: Mucous membranes are moist.     Pharynx: Oropharynx is clear. No oropharyngeal exudate or posterior oropharyngeal erythema.  Eyes:     General: No scleral icterus.       Right eye: No discharge.        Left eye: No discharge.     Conjunctiva/sclera: Conjunctivae normal.     Pupils: Pupils are equal, round, and reactive to light.  Neck:     Musculoskeletal: Normal range of motion and neck supple.     Thyroid: No thyromegaly.     Trachea: No tracheal deviation.  Cardiovascular:     Rate and Rhythm: Normal rate and regular rhythm.     Pulses: Normal pulses.     Heart sounds: Normal heart sounds. No murmur. No friction rub. No gallop.   Pulmonary:     Effort: Pulmonary effort is normal. No respiratory distress.     Breath sounds: Normal breath sounds. No stridor. No wheezing, rhonchi or rales.  Chest:     Chest wall: No tenderness.  Abdominal:      General: Abdomen is flat. Bowel sounds are normal. There is no distension.     Palpations: Abdomen is soft. There is no mass.     Tenderness: There is no abdominal tenderness. There is no right CVA tenderness, left CVA tenderness, guarding or rebound.     Hernia: No hernia is present.  Musculoskeletal: Normal range of motion.        General: No swelling, tenderness, deformity or  signs of injury.     Right lower leg: No edema.     Left lower leg: No edema.  Lymphadenopathy:     Cervical: No cervical adenopathy.  Skin:    General: Skin is warm and dry.     Capillary Refill: Capillary refill takes less than 2 seconds.     Coloration: Skin is not jaundiced or pale.     Findings: No bruising, erythema, lesion or rash.  Neurological:     General: No focal deficit present.     Mental Status: He is alert and oriented to person, place, and time.     Cranial Nerves: No cranial nerve deficit.     Sensory: No sensory deficit.     Motor: No weakness.     Coordination: Coordination normal.     Gait: Gait normal.     Deep Tendon Reflexes: Reflexes normal.  Psychiatric:        Mood and Affect: Mood normal.        Behavior: Behavior normal.        Thought Content: Thought content normal.        Judgment: Judgment normal.       Assessment & Plan:  1. Routine general medical examination at a health care facility - Continue to exercise and work on heart healthy diet.  - CBC with Differential/Platelet - CMP - Lipid panel - TSH - Hemoglobin A1c - POC Urinalysis Dipstick  2. Dehydration  - CK - Magnesium  3. Dyslipidemia - Anticipate on restarting tricor  - CBC with Differential/Platelet - CMP - Lipid panel - TSH - Hemoglobin A1c  4. Decreased exercise tolerance -He was encouraged to ease back into exercise.  Symptoms he was experiencing more probably normal due to a long layoff between strength training.  We talked about ways to recover quicker including heart healthy diet,  stretching after a hard workout, and staying hydrated.  He was advised that he can supplement Gatorade's here and there during hard workouts instead of just drinking water. - CBC with Differential/Platelet - CMP - Lipid panel - TSH - Hemoglobin A1c - CK - POC Urinalysis Dipstick - Magnesium  Shirline Frees, NP

## 2019-09-02 ENCOUNTER — Other Ambulatory Visit: Payer: Self-pay | Admitting: Adult Health

## 2019-09-02 ENCOUNTER — Telehealth: Payer: Self-pay | Admitting: Adult Health

## 2019-09-02 DIAGNOSIS — R748 Abnormal levels of other serum enzymes: Secondary | ICD-10-CM

## 2019-09-02 NOTE — Addendum Note (Signed)
Addended by: Suzette Battiest on: 09/02/2019 03:42 PM   Modules accepted: Orders

## 2019-09-02 NOTE — Addendum Note (Signed)
Addended by: Suzette Battiest on: 09/02/2019 03:40 PM   Modules accepted: Orders

## 2019-09-02 NOTE — Telephone Encounter (Signed)
Spoke to patient and informed him of his lab results.  Hepatitis panel was added on due to elevation in CK as well as elevated liver enzymes.  He is scheduled for an ultrasound of right upper quadrant tomorrow.  We will likely refer her over to neurology

## 2019-09-03 ENCOUNTER — Ambulatory Visit
Admission: RE | Admit: 2019-09-03 | Discharge: 2019-09-03 | Disposition: A | Discharge status: Home / Self Care | Payer: BC Managed Care – PPO | Source: Ambulatory Visit | Attending: Adult Health | Admitting: Adult Health

## 2019-09-03 ENCOUNTER — Telehealth: Payer: Self-pay | Admitting: Adult Health

## 2019-09-03 DIAGNOSIS — K824 Cholesterolosis of gallbladder: Secondary | ICD-10-CM | POA: Diagnosis not present

## 2019-09-03 DIAGNOSIS — R748 Abnormal levels of other serum enzymes: Secondary | ICD-10-CM

## 2019-09-03 DIAGNOSIS — K7689 Other specified diseases of liver: Secondary | ICD-10-CM | POA: Diagnosis not present

## 2019-09-03 LAB — HEPATITIS PANEL, ACUTE
Hep A IgM: NONREACTIVE
Hep B C IgM: NONREACTIVE
Hepatitis B Surface Ag: NONREACTIVE
Hepatitis C Ab: NONREACTIVE
SIGNAL TO CUT-OFF: 0.05 (ref <1.00)

## 2019-09-03 NOTE — Telephone Encounter (Signed)
Spoke to patient about his right upper quadrant ultrasound that showed fatty liver as well as an 8 mm polyp in his gallbladder.  I am going to refer him to general surgery per recommendation.  Was advised to work on weight loss through heart healthy diet and drink plenty of water.  Unfortunately, this does not explain the elevation in his CK muscle weakness.  I am going to refer him to neurology for further evaluation of this.  Also consider referring to rheumatology in the future

## 2019-09-06 ENCOUNTER — Encounter: Payer: Self-pay | Admitting: Neurology

## 2019-09-17 ENCOUNTER — Encounter: Payer: Self-pay | Admitting: Neurology

## 2019-09-17 ENCOUNTER — Ambulatory Visit (INDEPENDENT_AMBULATORY_CARE_PROVIDER_SITE_OTHER): Payer: BC Managed Care – PPO | Admitting: Neurology

## 2019-09-17 ENCOUNTER — Other Ambulatory Visit: Payer: Self-pay

## 2019-09-17 ENCOUNTER — Other Ambulatory Visit (INDEPENDENT_AMBULATORY_CARE_PROVIDER_SITE_OTHER): Payer: BC Managed Care – PPO

## 2019-09-17 VITALS — BP 138/90 | HR 63 | Ht 69.0 in | Wt 246.8 lb

## 2019-09-17 DIAGNOSIS — R748 Abnormal levels of other serum enzymes: Secondary | ICD-10-CM | POA: Diagnosis not present

## 2019-09-17 DIAGNOSIS — M6282 Rhabdomyolysis: Secondary | ICD-10-CM

## 2019-09-17 NOTE — Patient Instructions (Signed)
Check labs  NCS/EMG of the left arm and leg.    ELECTROMYOGRAM AND NERVE CONDUCTION STUDIES (EMG/NCS) INSTRUCTIONS  How to Prepare The neurologist conducting the EMG will need to know if you have certain medical conditions. Tell the neurologist and other EMG lab personnel if you: . Have a pacemaker or any other electrical medical device . Take blood-thinning medications . Have hemophilia, a blood-clotting disorder that causes prolonged bleeding Bathing Take a shower or bath shortly before your exam in order to remove oils from your skin. Don't apply lotions or creams before the exam.  What to Expect You'll likely be asked to change into a hospital gown for the procedure and lie down on an examination table. The following explanations can help you understand what will happen during the exam.  . Electrodes. The neurologist or a technician places surface electrodes at various locations on your skin depending on where you're experiencing symptoms. Or the neurologist may insert needle electrodes at different sites depending on your symptoms.  . Sensations. The electrodes will at times transmit a tiny electrical current that you may feel as a twinge or spasm. The needle electrode may cause discomfort or pain that usually ends shortly after the needle is removed. If you are concerned about discomfort or pain, you may want to talk to the neurologist about taking a short break during the exam.  . Instructions. During the needle EMG, the neurologist will assess whether there is any spontaneous electrical activity when the muscle is at rest - activity that isn't present in healthy muscle tissue - and the degree of activity when you slightly contract the muscle.  He or she will give you instructions on resting and contracting a muscle at appropriate times. Depending on what muscles and nerves the neurologist is examining, he or she may ask you to change positions during the exam.  After your EMG You may  experience some temporary, minor bruising where the needle electrode was inserted into your muscle. This bruising should fade within several days. If it persists, contact your primary care doctor.

## 2019-09-17 NOTE — Progress Notes (Signed)
[facility] Neurology Division Clinic Note - Initial Visit   Date: 09/17/19  Anthony CheshireWilliam Devon Tryon MRN: [id number] DOB: October 01, 1991   Dear Shirline Freesory Nafziger, NP:  Thank you for your kind referral of Anthony CheshireWilliam Devon Prusinski for consultation of hyperCKemia. Although his history is well known to you, please allow us to reiterate it for the purpose of our medical record. The patient was accompanied to the clinic by self.   History of Present Illness: Anthony Gaines is a 28 y.o. 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   Around 2014, he began noticing muscle soreness and dark-colored urine after physical exertion (weight lifting, cardio).  His urine cleared up within a few days, but muscle soreness and weakness would last about 2 weeks.  Over the next 5 years, he had a few spells of muscle pain and soreness which would occur in the first two weeks of starting an exercise program, overall mild.  There was no urine discoloration.    He took a break from lifting and exercise from December 2019 - October 2020.  In October 2020, he returned back to the gym and exercises for 45 min.  He felt very fatigued by the end of the workout.  No preceding dehydration or fasting. He following day, he developed muscle soreness but continued to exercise.  He noticed dark colored urine again which lasted 1.5 day.  He also had a severe cramp in the left thigh.  For the next two weeks, he struggled to do any physical activity.  For instance, he was able to do 15-20 pushup prior to exercising.  During this two week period, he was barely able to do 10 pushups because upper body felt very fatigued.    He has not returned to exercising.  For work, he walks and lifts objects regularly and denies any difficulty.   No family history of neuromuscular disease.   Out-side paper records, electronic medical record, and images have been reviewed  where available and summarized as:  Lab Results  Component Value Date   HGBA1C 5.3 09/01/2019   No results found for: [id number] Lab Results  Component Value Date   TSH 1.32 09/01/2019   Lab Results  Component Value Date   CKTOTAL 681 (H) 09/01/2019     Past Medical History:  Diagnosis Date  . History of kidney stones 2015    Past Surgical History:  Procedure Laterality Date  . CYSTOSCOPY WITH URETEROSCOPY AND STENT PLACEMENT Right 02/08/2014   Procedure: CYSTOSCOPY WITH RIGHT RETROGRADE PYELOGRAM RIGHT  URETEROSCOPY WITH LASER LITHOTRIPSY  BASKET EXTRACTION AND RIGHT STENT PLACEMENT;  Surgeon: Crist FatBenjamin W Herrick, MD;  Location: [facility];  Service: Urology;  Laterality: Right;     Medications:  No outpatient encounter medications on file as of 09/17/2019.   No facility-administered encounter medications on file as of 09/17/2019.     Allergies: No Known Allergies   Family History: Family History  Problem Relation Age of Onset  . Diabetes Maternal Grandmother   . Diabetes Maternal Uncle   . Healthy Mother   . Healthy Father   . Healthy Brother     Social History: Social History   Tobacco Use  . Smoking status: Never Smoker  . Smokeless tobacco: Never Used  Substance Use Topics  . Alcohol use: Yes    Comment: socially on the weekend   . Drug use: No   Social History   Social History Narrative   He  works as a Psychologist, sport and exercise    No kids        Review of Systems:  CONSTITUTIONAL: No fevers, chills, night sweats, or weight loss.   EYES: No visual changes or eye pain ENT: No hearing changes.  No history of nose bleeds.   RESPIRATORY: No cough, wheezing and shortness of breath.   CARDIOVASCULAR: Negative for chest pain, and palpitations.   GI: Negative for abdominal discomfort, blood in stools or black stools.  No recent change in bowel habits.   GU:  No history of incontinence.   MUSCLOSKELETAL: No history of joint pain or swelling.  No  myalgias.   SKIN: Negative for lesions, rash, and itching.   HEMATOLOGY/ONCOLOGY: Negative for prolonged bleeding, bruising easily, and swollen nodes.  No history of cancer.   ENDOCRINE: Negative for cold or heat intolerance, polydipsia or goiter.   PSYCH:  No depression or anxiety symptoms.   NEURO: As Above.   Vital Signs:  BP 138/90   Pulse 63   Ht 5\' 9"  (1.753 m)   Wt 246 lb 12.8 oz (111.9 kg)   SpO2 97%   BMI 36.45 kg/m    General Medical Exam:   General:  Well appearing, comfortable.   Eyes/ENT: see cranial nerve examination.   Neck:   No carotid bruits. Respiratory:  Clear to auscultation, good air entry bilaterally.   Cardiac:  Regular rate and rhythm, no murmur.   Extremities:  No deformities, edema, or skin discoloration.  Skin:  No rashes or lesions.  Neurological Exam: MENTAL STATUS including orientation to time, place, person, recent and remote memory, attention span and concentration, language, and fund of knowledge is normal.  Speech is not dysarthric.  CRANIAL NERVES: II:  No visual field defects.   III-IV-VI: Pupils equal round and reactive to light.  Normal conjugate, extra-ocular eye movements in all directions of gaze.  No nystagmus.  No ptosis.  No eyelid myotonia. V:  Normal facial sensation.    VII:  Normal facial symmetry and movements.   VIII:  Normal hearing and vestibular function.   IX-X:  Normal palatal movement.   XI:  Normal shoulder shrug and head rotation.   XII:  Normal tongue strength and range of motion, no deviation or fasciculation.  MOTOR:  No atrophy, fasciculations or abnormal movements.  No pronator drift. No grip or percussion myotonia.  Upper Extremity:  Right  Left  Deltoid  5/5   5/5   Biceps  5/5   5/5   Triceps  5/5   5/5   Infraspinatus 5/5  5/5  Medial pectoralis 5/5  5/5  Wrist extensors  5/5   5/5   Wrist flexors  5/5   5/5   Finger extensors  5/5   5/5   Finger flexors  5/5   5/5   Dorsal interossei  5/5   5/5    Abductor pollicis  5/5   5/5   Tone (Ashworth scale)  0  0   Lower Extremity:  Right  Left  Hip flexors  5/5   5/5   Hip extensors  5/5   5/5   Adductor 5/5  5/5  Abductor 5/5  5/5  Knee flexors  5/5   5/5   Knee extensors  5/5   5/5   Dorsiflexors  5/5   5/5   Plantarflexors  5/5   5/5   Toe extensors  5/5   5/5   Toe flexors  5/5  5/5   Tone (Ashworth scale)  0  0   MSRs:  Right        Left                  brachioradialis 2+  2+  biceps 2+  2+  triceps 2+  2+  patellar 2+  2+  ankle jerk 2+  2+  Hoffman no  no  plantar response down  down   SENSORY:  Normal and symmetric perception of light touch, pinprick, vibration, and proprioception.  Romberg's sign absent.   COORDINATION/GAIT: Normal finger-to- nose-finger and heel-to-shin.  Intact rapid alternating movements bilaterally.  Able to rise from a chair without using arms.  Gait narrow based and stable. Tandem and stressed gait intact.    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  He has history of rhabdomyolysis with myoglobinuria following prolonged exercise, especially when deconditioned.  I will check baseline CK and EDX to detect any underlying myopathy.  No features on exam to suggest muscular dystrophy or hereditary myopathy.  I am more concerned about metabolic glycogen vs fatty acid oxidation (CPT II deficiency) disorder.    PLAN/RECOMMENDATIONS:  Check CK, aldolase, CMP NCS/EMG of the left arm and leg Consider metabolic testing going forward Advised to avoid high intensity exercise  Further recommendations pending results.     Thank you for allowing me to participate in patient's care.  If I can answer any additional questions, I would be pleased to do so.    Sincerely,     K. Posey Pronto, DO

## 2019-09-20 LAB — COMPREHENSIVE METABOLIC PANEL
AG Ratio: 1.5 (calc) (ref 1.0–2.5)
ALT: 59 U/L — ABNORMAL HIGH (ref 9–46)
AST: 36 U/L (ref 10–40)
Albumin: 4.8 g/dL (ref 3.6–5.1)
Alkaline phosphatase (APISO): 69 U/L (ref 36–130)
BUN: 11 mg/dL (ref 7–25)
CO2: 21 mmol/L (ref 20–32)
Calcium: 9.5 mg/dL (ref 8.6–10.3)
Chloride: 107 mmol/L (ref 98–110)
Creat: 0.91 mg/dL (ref 0.60–1.35)
Globulin: 3.3 g/dL (calc) (ref 1.9–3.7)
Glucose, Bld: 81 mg/dL (ref 65–99)
Potassium: 4.1 mmol/L (ref 3.5–5.3)
Sodium: 139 mmol/L (ref 135–146)
Total Bilirubin: 0.5 mg/dL (ref 0.2–1.2)
Total Protein: 8.1 g/dL (ref 6.1–8.1)

## 2019-09-20 LAB — CK: Total CK: 444 U/L — ABNORMAL HIGH (ref 44–196)

## 2019-09-20 LAB — ALDOLASE: Aldolase: 7.3 U/L (ref <=8.1)

## 2019-10-04 ENCOUNTER — Other Ambulatory Visit: Payer: Self-pay

## 2019-10-05 ENCOUNTER — Other Ambulatory Visit: Payer: Self-pay

## 2019-10-05 ENCOUNTER — Ambulatory Visit (INDEPENDENT_AMBULATORY_CARE_PROVIDER_SITE_OTHER): Payer: BC Managed Care – PPO | Admitting: Neurology

## 2019-10-05 DIAGNOSIS — R748 Abnormal levels of other serum enzymes: Secondary | ICD-10-CM

## 2019-10-05 DIAGNOSIS — M6282 Rhabdomyolysis: Secondary | ICD-10-CM

## 2019-10-05 DIAGNOSIS — M791 Myalgia, unspecified site: Secondary | ICD-10-CM

## 2019-10-05 NOTE — Procedures (Signed)
[facility] Neurology  [address] [place], [address]  [place], [place] [address] Tel: [phone] Fax:  [phone] Test Date:  10/05/2019  Patient: Anthony Gaines DOB: [birth year] Physician: Nita Sickle, DO  Sex: Male Height: 5\' 9"  Ref Phys: , DO  ID#: Nita Sickle Temp: 34.0C Technician:    Patient Complaints: This is a 28 year old man with exertional hyperCKenemia and myalgias referred for evaluation of myopathy.  NCV & EMG Findings: Extensive electrodiagnostic testing of the left upper and lower extremity shows:  1. Left median, ulnar, and sural sensory responses are within normal limits. 2. Left median, ulnar, peroneal, and tibial motor responses are within normal limits. 3. Motor unit configuration and recruitment pattern is within normal limits in all the tested muscles.  Specifically, there is no evidence of complex, polyphasic motor units to suggest myopathy.  Impression: This is a normal study of the left upper and lower extremities.  In particular, there is no evidence of a myopathy, cervical/lumbosacral radiculopathy, or sensorimotor polyneuropathy.   ___________________________ 37, DO    Nerve Conduction Studies Anti Sensory Summary Table   Site NR Peak (ms) Norm Peak (ms) P-T Amp (V) Norm P-T Amp  Left Median Anti Sensory (2nd Digit)  34C  Wrist    3.2 <3.3 39.7 >20  Left Sural Anti Sensory (Lat Mall)  34C  Calf    2.9 <4.4 22.0 >6  Left Ulnar Anti Sensory (5th Digit)  34C  Wrist    2.8 <3.0 37.9 >18   Motor Summary Table   Site NR Onset (ms) Norm Onset (ms) O-P Amp (mV) Norm O-P Amp Site1 Site2 Delta-0 (ms) Dist (cm) Vel (m/s) Norm Vel (m/s)  Left Median Motor (Abd Poll Brev)  34C  Wrist    2.6 <3.9 14.2 >6 Elbow Wrist 5.4 33.0 61 >51  Elbow    8.0  13.9         Left Peroneal Motor (Ext Dig Brev)  34C  Ankle    3.2 <5.5 12.2 >3 B Fib Ankle 7.7 41.0 53 >41  B Fib    10.9  11.0  Poplt B Fib 1.4 10.0 71 >41  Poplt    12.3  10.9          Left Peroneal TA Motor (Tib Ant)  34C  Fib Head    2.3 <4.0 6.1 >4 Poplit Fib Head 1.3 10.0 77 >41  Poplit    3.6  5.4         Left Tibial Motor (Abd Hall Brev)  34C  Ankle    3.4 <5.8 14.7 >8 Knee Ankle 8.6 41.0 48 >41  Knee    12.0  11.8         Left Ulnar Motor (Abd Dig Minimi)  34C  Wrist    2.4 <3.0 11.0 >8 B Elbow Wrist 4.2 26.0 62 >51  B Elbow    6.6  10.3  A Elbow B Elbow 1.8 10.0 56 >51  A Elbow    8.4  10.1          EMG   Side Muscle Ins Act Fibs Psw Fasc Number Recrt Dur Dur. Amp Amp. Poly Poly. Comment  Left AntTibialis Nml Nml Nml Nml Nml Nml Nml Nml Nml Nml Nml Nml N/A  Left Gastroc Nml Nml Nml Nml Nml Nml Nml Nml Nml Nml Nml Nml N/A  Left RectFemoris Nml Nml Nml Nml Nml Nml Nml Nml Nml Nml Nml Nml N/A  Left GluteusMed Nml Nml Nml Nml  Nml Nml Nml Nml Nml Nml Nml Nml N/A  Left Lumbo Parasp Low Nml Nml Nml Nml Nml Nml Nml Nml Nml Nml Nml Nml N/A  Left Iliacus Nml Nml Nml Nml Nml Nml Nml Nml Nml Nml Nml Nml N/A  Left 1stDorInt Nml Nml Nml Nml Nml Nml Nml Nml Nml Nml Nml Nml N/A  Left PronatorTeres Nml Nml Nml Nml Nml Nml Nml Nml Nml Nml Nml Nml N/A  Left Biceps Nml Nml Nml Nml Nml Nml Nml Nml Nml Nml Nml Nml N/A  Left Triceps Nml Nml Nml Nml Nml Nml Nml Nml Nml Nml Nml Nml N/A  Left Deltoid Nml Nml Nml Nml Nml Nml Nml Nml Nml Nml Nml Nml N/A  Left Infraspinatus Nml Nml Nml Nml Nml Nml Nml Nml Nml Nml Nml Nml N/A      Waveforms:

## 2019-10-06 ENCOUNTER — Telehealth: Payer: Self-pay | Admitting: Neurology

## 2019-10-06 NOTE — Telephone Encounter (Signed)
Left message for lab to clarify if patient needs to be fasting.  Paperwork filled out and sent to [employer].

## 2019-10-06 NOTE — Telephone Encounter (Signed)
Patient informed that EMG is normal, specifically no evidence of myopathy.  Next step is to evaluate for fatty acid oxidation disorder through [facility] - kit will be mailed to patient. I will need to check plasma acylcarnitine level so he can be eligible for sponsorship program.

## 2019-10-07 ENCOUNTER — Encounter: Payer: BC Managed Care – PPO | Admitting: Neurology

## 2019-10-12 ENCOUNTER — Telehealth: Payer: Self-pay

## 2019-10-12 DIAGNOSIS — R748 Abnormal levels of other serum enzymes: Secondary | ICD-10-CM

## 2019-10-12 DIAGNOSIS — M791 Myalgia, unspecified site: Secondary | ICD-10-CM

## 2019-10-12 DIAGNOSIS — M6282 Rhabdomyolysis: Secondary | ICD-10-CM

## 2019-10-12 NOTE — Telephone Encounter (Signed)
Left message for patient to schedule lab appointment for acylcarnitine lab.  Patient does not need to be fasting.  Order placed.

## 2019-10-18 ENCOUNTER — Ambulatory Visit: Payer: BC Managed Care – PPO | Admitting: Neurology

## 2019-12-14 ENCOUNTER — Telehealth: Payer: Self-pay | Admitting: Neurology

## 2019-12-14 NOTE — Telephone Encounter (Signed)
Called patient and left a message re: to schedule VV/telephone visit follow-up to discuss results - ok to add on Thursday morning (any time over the next few weeks).

## 2019-12-15 NOTE — Telephone Encounter (Signed)
Called patient and left a message re: to schedule VV/telephone visit follow-up to discuss results 

## 2019-12-16 NOTE — Telephone Encounter (Signed)
Called patient and left a message re: to schedule VV/telephone visit follow-up to discuss results

## 2019-12-24 ENCOUNTER — Encounter: Payer: Self-pay | Admitting: Neurology

## 2019-12-24 NOTE — Telephone Encounter (Signed)
Patient has not returned multiple calls for scheduling. Okay to send letter we've been trying to reach him?

## 2019-12-24 NOTE — Telephone Encounter (Signed)
Yes, ok to send letter

## 2019-12-24 NOTE — Telephone Encounter (Signed)
Letter sent.

## 2020-06-14 ENCOUNTER — Encounter: Payer: Self-pay | Admitting: Adult Health

## 2020-06-22 DIAGNOSIS — L709 Acne, unspecified: Secondary | ICD-10-CM | POA: Diagnosis not present

## 2020-08-15 ENCOUNTER — Encounter: Payer: BC Managed Care – PPO | Admitting: Adult Health

## 2020-08-15 NOTE — Progress Notes (Deleted)
   Subjective:    Patient ID: Anthony Gaines, male    DOB: 12/13/1990, 29 y.o.   MRN: [id number]  HPI Patient presents for yearly preventative medicine examination. He is a pleasant 29 year old male who  has a past medical history of History of kidney stones (2015).  Elevated Triglyecerides  All immunizations and health maintenance protocols were reviewed with the patient and needed orders were placed.  Appropriate screening laboratory values were ordered for the patient including screening of hyperlipidemia, renal function and hepatic function. If indicated by BPH, a PSA was ordered.  Medication reconciliation,  past medical history, social history, problem list and allergies were reviewed in detail with the patient  Goals were established with regard to weight loss, exercise, and  diet in compliance with medications  End of life planning was discussed.     Review of Systems     Objective:   Physical Exam        Assessment & Plan:

## 2020-10-03 ENCOUNTER — Emergency Department (HOSPITAL_COMMUNITY)
Admission: EM | Admit: 2020-10-03 | Discharge: 2020-10-03 | Disposition: A | Discharge status: Home / Self Care | Payer: HRSA Program | Attending: Emergency Medicine | Admitting: Emergency Medicine

## 2020-10-03 ENCOUNTER — Other Ambulatory Visit: Payer: Self-pay

## 2020-10-03 DIAGNOSIS — R000 Tachycardia, unspecified: Secondary | ICD-10-CM | POA: Diagnosis not present

## 2020-10-03 DIAGNOSIS — U071 COVID-19: Secondary | ICD-10-CM | POA: Diagnosis not present

## 2020-10-03 DIAGNOSIS — R509 Fever, unspecified: Secondary | ICD-10-CM | POA: Diagnosis present

## 2020-10-03 MED ORDER — PROMETHAZINE-DM 6.25-15 MG/5ML PO SYRP: 5.0000 mL | ORAL_SOLUTION | Freq: Four times a day (QID) | ORAL | 0 refills | Status: AC | PRN

## 2020-10-03 MED ORDER — KETOROLAC TROMETHAMINE 15 MG/ML IJ SOLN
15.0000 mg | Freq: Once | INTRAMUSCULAR | Status: AC
  Administered 2020-10-03: 15 mg via INTRAMUSCULAR
  Filled 2020-10-03: qty 1

## 2020-10-03 MED ORDER — IBUPROFEN 800 MG PO TABS
800.0000 mg | ORAL_TABLET | Freq: Once | ORAL | Status: DC
  Filled 2020-10-03: qty 1

## 2020-10-03 NOTE — ED Provider Notes (Signed)
[facility]-EMERGENCY DEPT Provider Note   CSN: [id number] Arrival date & time: 10/03/20  1211     History Chief Complaint  Patient presents with  . Fever    Anthony Gaines is a 29 y.o. male presenting for evaluation of fever, cough, decreased appetite, body aches, and diarrhea.  Patient states his symptoms began on Thursday, 5 days ago.  He was tested for Covid 3 days ago, test was positive.  He has a telehealth visit, was called in Tessalon, steroids, azithromycin, and vitamin C and D3.  He has been taking this without improvement of his symptoms.  He reports continued fevers, T-max 104 this morning.  He takes Tylenol every 6 hours for this.  He is not taking any Motrin or NSAIDs.  He denies worsening symptoms.  No increase shortness of breath or chest pain.  He does report decreased appetite, but no nausea or vomiting.  He is having diarrhea, is nonbloody.  He has no medical problems, takes no medications daily.  No history of asthma or COPD.  No sick contacts.  He is unvaccinated.  HPI     Past Medical History:  Diagnosis Date  . History of kidney stones 2015    Patient Active Problem List   Diagnosis Date Noted  . Kidney stone on right side 10/20/2017  . ALLERGIC RHINITIS 12/25/2009  . KNEE SPRAIN 12/25/2009  . ANKLE SPRAIN 12/25/2009    Past Surgical History:  Procedure Laterality Date  . CYSTOSCOPY WITH URETEROSCOPY AND STENT PLACEMENT Right 02/08/2014   Procedure: CYSTOSCOPY WITH RIGHT RETROGRADE PYELOGRAM RIGHT  URETEROSCOPY WITH LASER LITHOTRIPSY  BASKET EXTRACTION AND RIGHT STENT PLACEMENT;  Surgeon: Crist Fat, MD;  Location: [facility];  Service: Urology;  Laterality: Right;       Family History  Problem Relation Age of Onset  . Diabetes Maternal Grandmother   . Diabetes Maternal Uncle   . Healthy Mother   . Healthy Father   . Healthy Brother     Social History   Tobacco Use  . Smoking status: Never Smoker  . Smokeless  tobacco: Never Used  Vaping Use  . Vaping Use: Never used  Substance Use Topics  . Alcohol use: Yes    Comment: socially on the weekend   . Drug use: No    Home Medications Prior to Admission medications   Not on File    Allergies    Patient has no known allergies.  Review of Systems   Review of Systems  Constitutional: Positive for appetite change, chills and fever.  Respiratory: Positive for cough.   Gastrointestinal: Positive for diarrhea.  Musculoskeletal: Positive for myalgias.  All other systems reviewed and are negative.   Physical Exam Updated Vital Signs BP 131/86 (BP Location: Right Arm)   Pulse (!) 123   Temp (!) 101.1 F (38.4 C) (Oral)   Resp (!) 24   SpO2 96%   Physical Exam Vitals and nursing note reviewed.  Constitutional:      General: He is not in acute distress.    Appearance: He is well-developed and well-nourished.     Comments: Resting in the bed in no acute distress  HENT:     Head: Normocephalic and atraumatic.  Eyes:     Extraocular Movements: Extraocular movements intact and EOM normal.     Conjunctiva/sclera: Conjunctivae normal.     Pupils: Pupils are equal, round, and reactive to light.  Cardiovascular:     Rate and Rhythm: Regular rhythm.  Tachycardia present.     Pulses: Normal pulses and intact distal pulses.     Comments: Mildly tachycardic, 105 at rest Pulmonary:     Effort: Pulmonary effort is normal. No respiratory distress.     Breath sounds: Normal breath sounds. No wheezing.     Comments: Speaking in full sentences.  Clear lung sounds in all fields.  I personally ambulated patient around the room, sats never dropped below 95%. Abdominal:     General: There is no distension.     Palpations: Abdomen is soft. There is no mass.     Tenderness: There is no abdominal tenderness. There is no guarding or rebound.  Musculoskeletal:        General: Normal range of motion.     Cervical back: Normal range of motion and neck  supple.  Skin:    General: Skin is warm and dry.     Capillary Refill: Capillary refill takes less than 2 seconds.  Neurological:     Mental Status: He is alert and oriented to person, place, and time.  Psychiatric:        Mood and Affect: Mood and affect normal.     ED Results / Procedures / Treatments   Labs (all labs ordered are listed, but only abnormal results are displayed) Labs Reviewed - No data to display  EKG None  Radiology No results found.  Procedures Procedures (including critical care time)  Medications Ordered in ED Medications  ibuprofen (ADVIL) tablet 800 mg (has no administration in time range)  ketorolac (TORADOL) 15 MG/ML injection 15 mg (has no administration in time range)    ED Course  I have reviewed the triage vital signs and the nursing notes.  Pertinent labs & imaging results that were available during my care of the patient were reviewed by me and considered in my medical decision making (see chart for details).    MDM Rules/Calculators/A&P                          Patient presenting for evaluation of coivd sxs in the setting of covid infection.  On exam, pulmonary exam is reassuring.  He is mildly tachycardic, but he is febrile.  Oxygen sats reassuring, even with ambulation.  As his oxygen levels are reassuring, I do not feel he needs an x-ray, as I have low suspicion for superimposed bacterial infection.  I discussed continued symptomatic treatment and importance of quarantine.  Discussed close monitoring of respiratory status, return with increased shortness of breath or low oxygen saturations.  At this time, patient appears safe for discharge.  Patient states he understands and agrees to plan.     Anthony Gaines was evaluated in Emergency Department on 10/03/2020 for the symptoms described in the history of present illness. He was evaluated in the context of the global COVID-19 pandemic, which necessitated consideration that the  patient might be at risk for infection with the SARS-CoV-2 virus that causes COVID-19. Institutional protocols and algorithms that pertain to the evaluation of patients at risk for COVID-19 are in a state of rapid change based on information released by regulatory bodies including the CDC and federal and state organizations. These policies and algorithms were followed during the patient's care in the ED.  Final Clinical Impression(s) / ED Diagnoses Final diagnoses:  COVID-19    Rx / DC Orders ED Discharge Orders    None       Alveria Apley, PA-C 10/03/20  1339    Tegeler, Canary Brim, MD 10/03/20 1714

## 2020-10-03 NOTE — ED Notes (Signed)
Pt ambulated in room on room air.  Pts baseline O2 sats 96%, ambulated appx 20 feet, O2 sats on room air maintained 95%.

## 2020-10-03 NOTE — ED Notes (Signed)
Discharge paperwork reviewed with pt.  Pt with no questions or concerns at time of discharge, ambulatory to ED exit.  °

## 2020-10-03 NOTE — ED Triage Notes (Signed)
Pt POV and reports being dx with COVID-19 on Saturday.  Pt reports ongoing fever, highest recorded temp at home 104.0.  Pt reports taking tylenol today at appx 1100, temp on triage 101.1.

## 2021-05-05 IMAGING — US US ABDOMEN LIMITED
1 series · 13 of 25 positions shown · non-contrast
Comparison: Ultrasound right upper quadrant May 23, 2017

CLINICAL DATA: Elevated liver enzymes

EXAM:
ULTRASOUND ABDOMEN LIMITED RIGHT UPPER QUADRANT

[Series 1: us abdomen limited · 0.26mm/px · 13 of 63 slices shown]
[im 1/63]
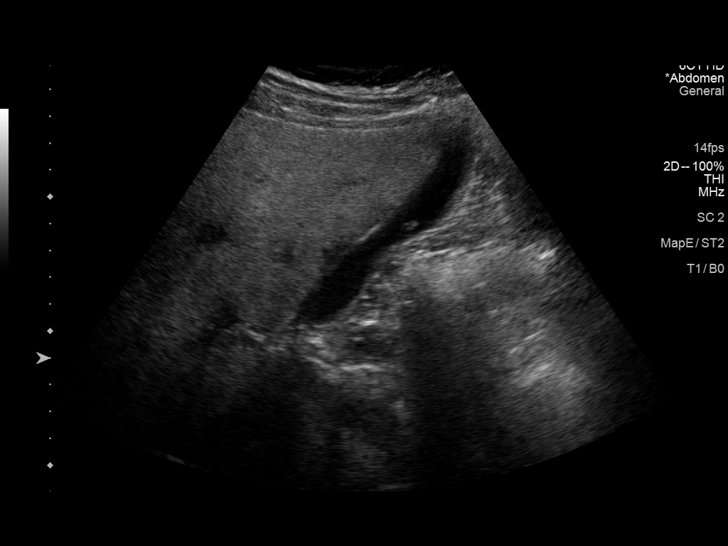
[im 6/63]
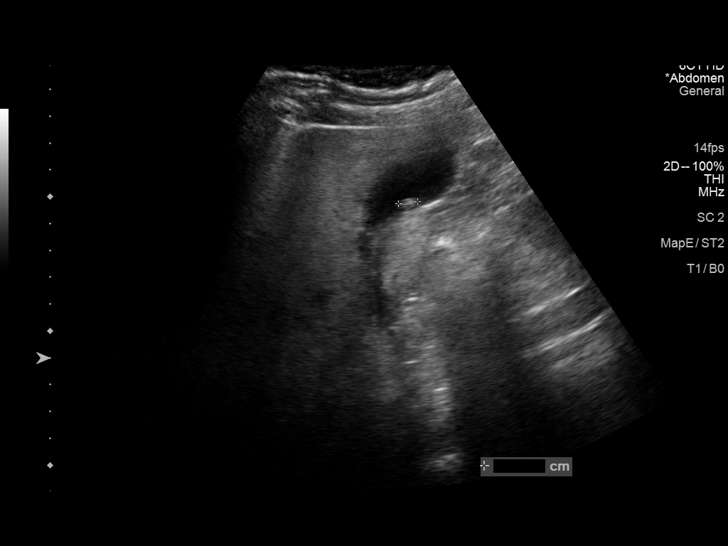
[im 11/63]
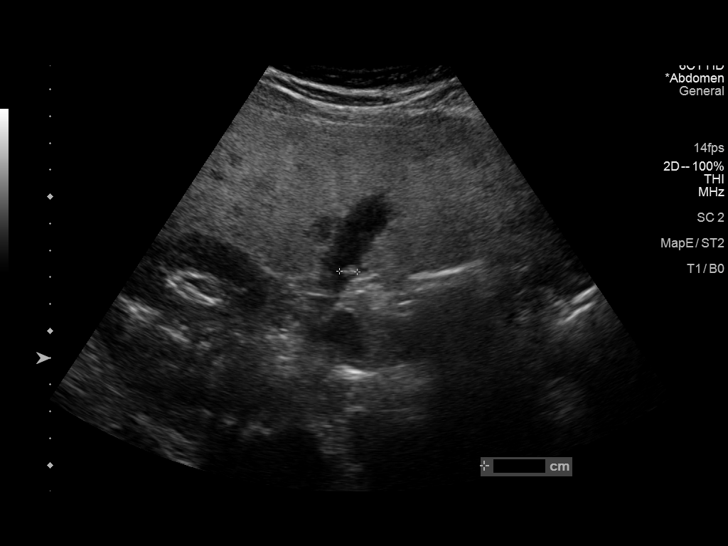
[im 16/63]
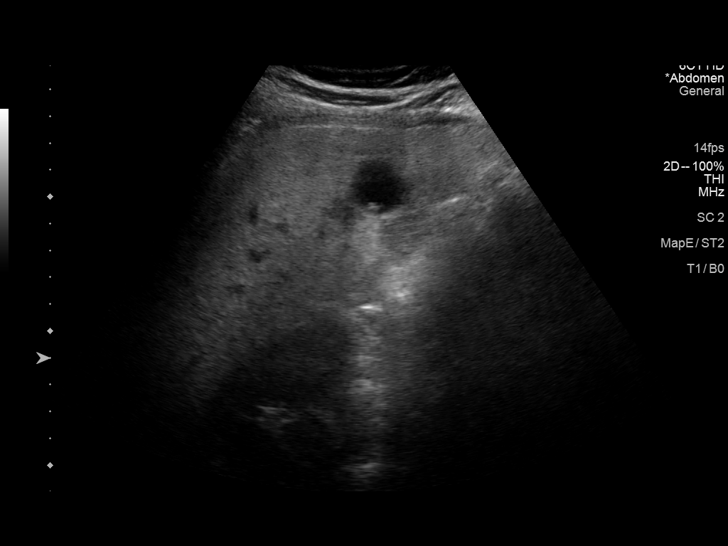
[im 21/63]
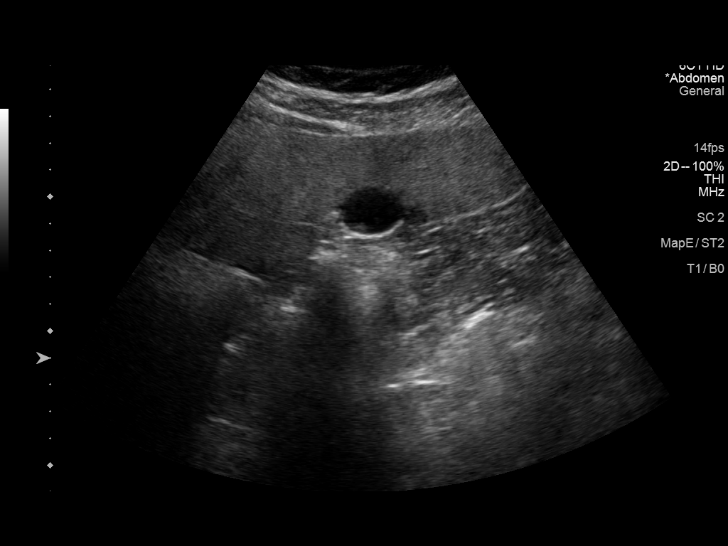
[im 26/63]
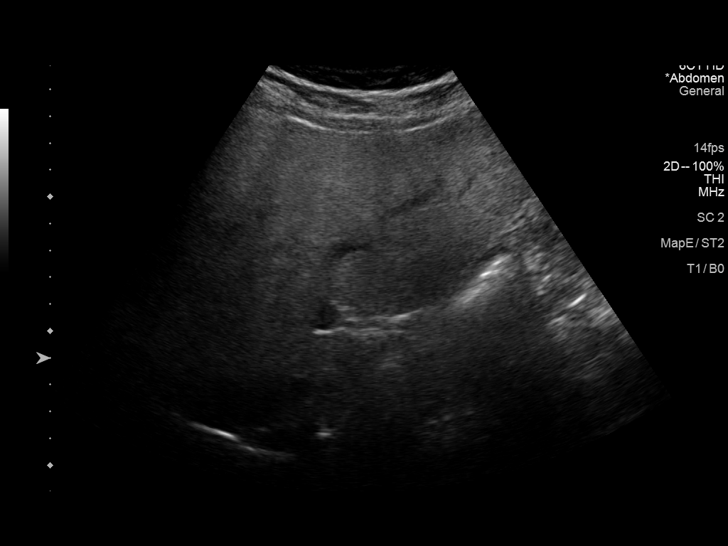
[im 32/63]
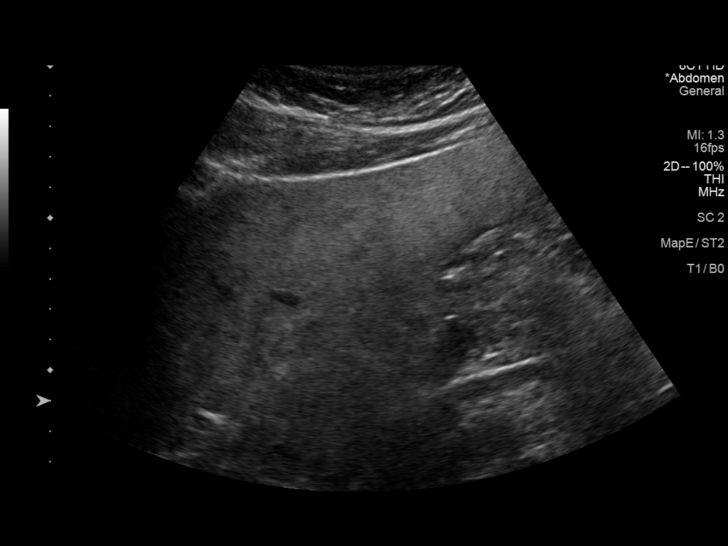
[im 37/63]
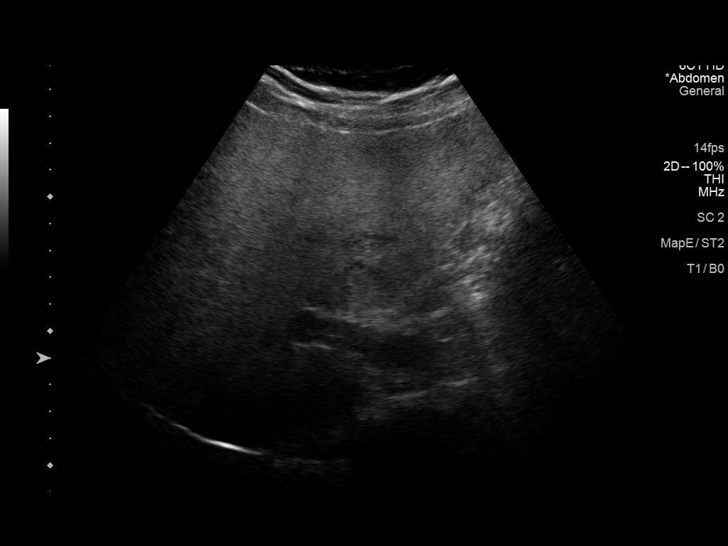
[im 42/63]
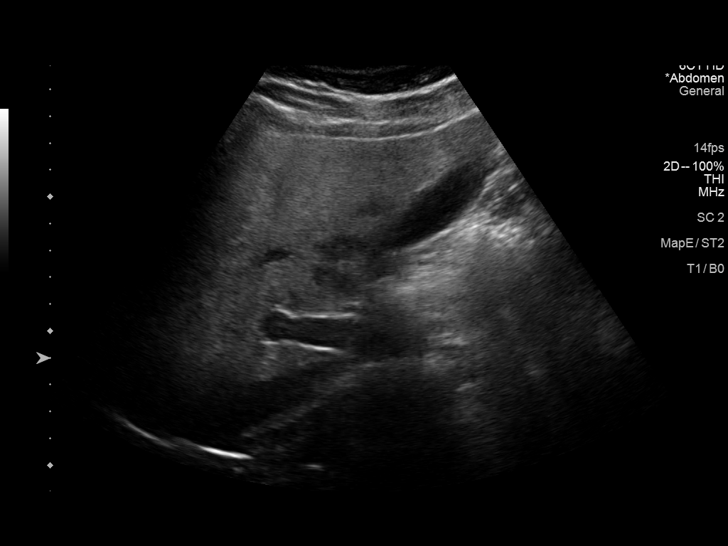
[im 47/63]
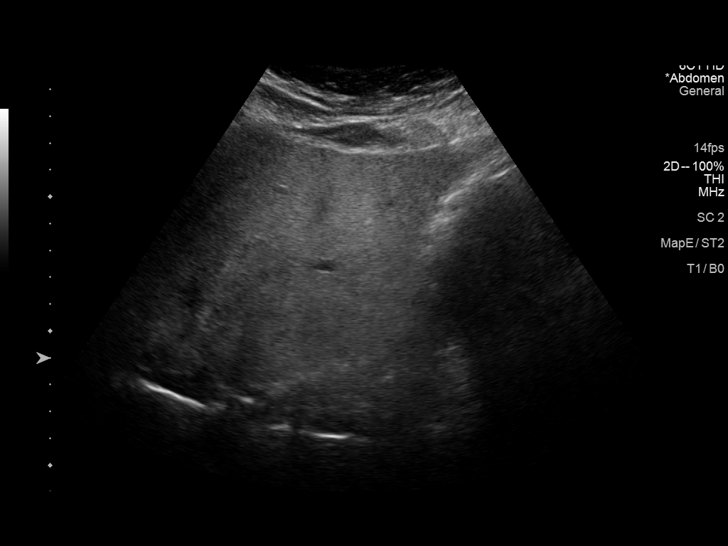
[im 52/63]
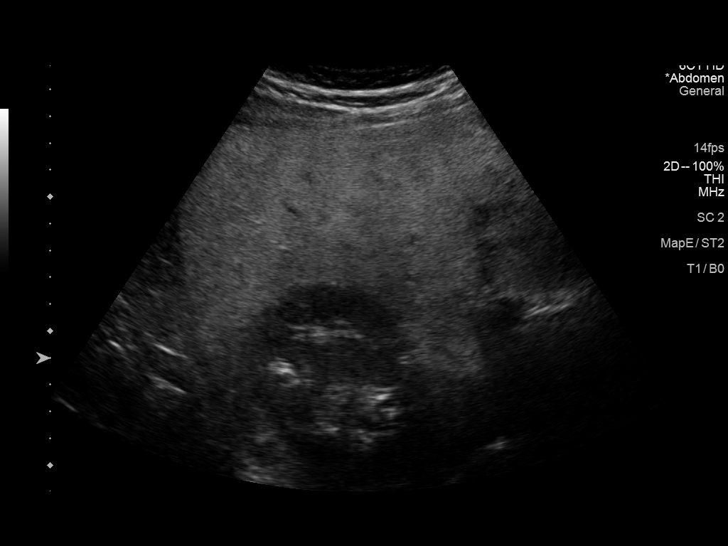
[im 57/63]
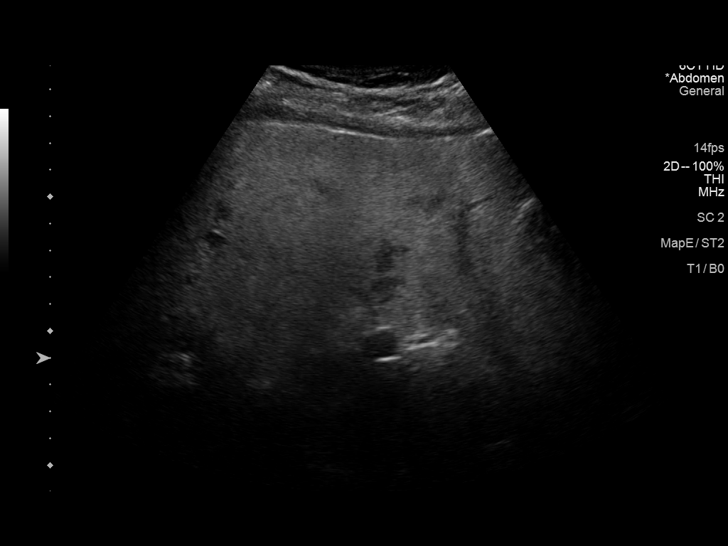
[im 63/63]
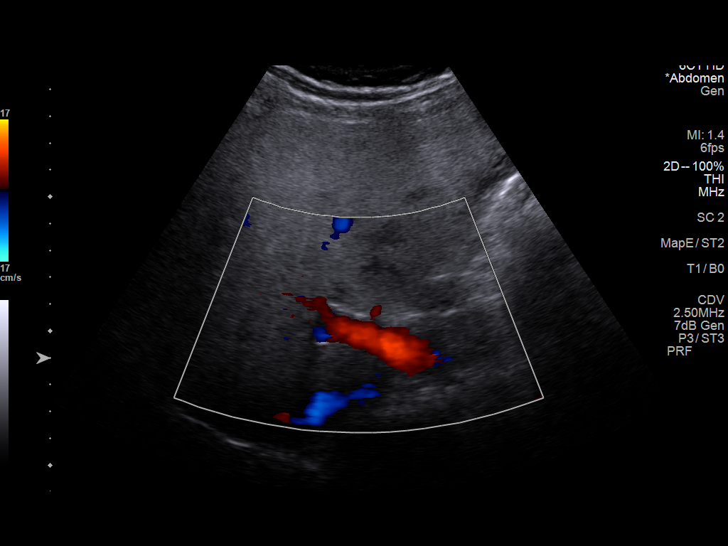

[13 of 25 positions shown; findings below may reference images not displayed]

FINDINGS: Gallbladder:

Within the gallbladder, there is an 8 mm echogenic focus which
neither moves nor shadows, an apparent polyp. On previous study,
this polyp measured 6 mm. There are no echogenic foci in the
gallbladder which move and shadow as is expected with gallstones.
There is no gallbladder wall thickening or pericholecystic fluid. No
sonographic Murphy sign noted by sonographer.

Common bile duct:

Diameter: 5 mm. No intrahepatic or extrahepatic biliary duct
dilatation.

Liver:

No focal lesion identified. Liver echogenicity is increased
diffusely with probable fatty sparing near the gallbladder fossa.
Portal vein is patent on color Doppler imaging with normal direction
of blood flow towards the liver.

Other: None.
IMPRESSION: 1. 8 mm polyp within the gallbladder. This polyp has increased in
size compared to the previous study. A polyp which has increased in
size warrants surgical consultation per consensus guidelines.
Gallbladder otherwise appears normal.

2. Increased liver echogenicity, a finding most likely indicative of
hepatic steatosis. Underlying parenchymal liver disease may well be
present additionally. Probable fatty sparing near the gallbladder
fossa region. While no focal liver lesions are evident on this study
beyond apparent mild fatty sparing, it must be cautioned that the
sensitivity of ultrasound for detection of focal liver lesions is
diminished in this circumstance.

These results will be called to the ordering clinician or
representative by the Radiologist Assistant, and communication
documented in the PACS or zVision Dashboard.
